# Patient Record
Sex: Female | Born: 1945
Health system: Southern US, Community
[De-identification: ages and names within clinical notes are randomized; demographics above are authoritative.]

## PROBLEM LIST (undated history)

## (undated) DIAGNOSIS — I10 Essential (primary) hypertension: Secondary | ICD-10-CM

## (undated) DIAGNOSIS — F329 Major depressive disorder, single episode, unspecified: Secondary | ICD-10-CM

## (undated) DIAGNOSIS — K589 Irritable bowel syndrome without diarrhea: Secondary | ICD-10-CM

## (undated) DIAGNOSIS — E785 Hyperlipidemia, unspecified: Secondary | ICD-10-CM

## (undated) DIAGNOSIS — J45909 Unspecified asthma, uncomplicated: Secondary | ICD-10-CM

## (undated) DIAGNOSIS — J439 Emphysema, unspecified: Secondary | ICD-10-CM

## (undated) DIAGNOSIS — I519 Heart disease, unspecified: Secondary | ICD-10-CM

## (undated) DIAGNOSIS — F32A Depression, unspecified: Secondary | ICD-10-CM

## (undated) DIAGNOSIS — E119 Type 2 diabetes mellitus without complications: Secondary | ICD-10-CM

## (undated) HISTORY — PX: OTHER SURGICAL HISTORY: SHX169

## (undated) HISTORY — PX: ABDOMINAL HYSTERECTOMY: SHX81

## (undated) HISTORY — DX: Depression, unspecified: F32.A

## (undated) HISTORY — DX: Unspecified asthma, uncomplicated: J45.909

## (undated) HISTORY — DX: Heart disease, unspecified: I51.9

## (undated) HISTORY — DX: Type 2 diabetes mellitus without complications: E11.9

## (undated) HISTORY — DX: Emphysema, unspecified: J43.9

## (undated) HISTORY — DX: Hyperlipidemia, unspecified: E78.5

## (undated) HISTORY — DX: Irritable bowel syndrome, unspecified: K58.9

## (undated) HISTORY — DX: Essential (primary) hypertension: I10

## (undated) HISTORY — PX: CHOLECYSTECTOMY: SHX55

## (undated) HISTORY — DX: Major depressive disorder, single episode, unspecified: F32.9

---

## 2016-11-08 DIAGNOSIS — D509 Iron deficiency anemia, unspecified: Secondary | ICD-10-CM | POA: Diagnosis not present

## 2016-11-08 DIAGNOSIS — J302 Other seasonal allergic rhinitis: Secondary | ICD-10-CM | POA: Diagnosis not present

## 2016-11-08 DIAGNOSIS — E119 Type 2 diabetes mellitus without complications: Secondary | ICD-10-CM | POA: Diagnosis not present

## 2016-11-08 DIAGNOSIS — M797 Fibromyalgia: Secondary | ICD-10-CM | POA: Diagnosis not present

## 2016-11-08 DIAGNOSIS — J449 Chronic obstructive pulmonary disease, unspecified: Secondary | ICD-10-CM | POA: Diagnosis not present

## 2016-11-08 DIAGNOSIS — E559 Vitamin D deficiency, unspecified: Secondary | ICD-10-CM | POA: Diagnosis not present

## 2016-11-08 DIAGNOSIS — I4891 Unspecified atrial fibrillation: Secondary | ICD-10-CM | POA: Diagnosis not present

## 2017-01-16 DIAGNOSIS — J45909 Unspecified asthma, uncomplicated: Secondary | ICD-10-CM | POA: Diagnosis not present

## 2017-01-16 DIAGNOSIS — Z0001 Encounter for general adult medical examination with abnormal findings: Secondary | ICD-10-CM | POA: Diagnosis not present

## 2017-01-16 DIAGNOSIS — F17210 Nicotine dependence, cigarettes, uncomplicated: Secondary | ICD-10-CM | POA: Diagnosis not present

## 2017-01-16 DIAGNOSIS — J449 Chronic obstructive pulmonary disease, unspecified: Secondary | ICD-10-CM | POA: Diagnosis not present

## 2017-01-16 DIAGNOSIS — E119 Type 2 diabetes mellitus without complications: Secondary | ICD-10-CM | POA: Diagnosis not present

## 2017-01-16 DIAGNOSIS — I050 Rheumatic mitral stenosis: Secondary | ICD-10-CM | POA: Diagnosis not present

## 2017-01-16 DIAGNOSIS — F17200 Nicotine dependence, unspecified, uncomplicated: Secondary | ICD-10-CM | POA: Diagnosis not present

## 2017-01-16 DIAGNOSIS — E669 Obesity, unspecified: Secondary | ICD-10-CM | POA: Diagnosis not present

## 2017-01-16 DIAGNOSIS — Z716 Tobacco abuse counseling: Secondary | ICD-10-CM | POA: Diagnosis not present

## 2017-03-03 DIAGNOSIS — I10 Essential (primary) hypertension: Secondary | ICD-10-CM | POA: Diagnosis not present

## 2017-03-03 DIAGNOSIS — F3289 Other specified depressive episodes: Secondary | ICD-10-CM | POA: Diagnosis not present

## 2017-03-03 DIAGNOSIS — R634 Abnormal weight loss: Secondary | ICD-10-CM | POA: Diagnosis not present

## 2017-03-03 DIAGNOSIS — J449 Chronic obstructive pulmonary disease, unspecified: Secondary | ICD-10-CM | POA: Diagnosis not present

## 2017-03-03 DIAGNOSIS — E784 Other hyperlipidemia: Secondary | ICD-10-CM | POA: Diagnosis not present

## 2017-03-03 DIAGNOSIS — F17210 Nicotine dependence, cigarettes, uncomplicated: Secondary | ICD-10-CM | POA: Diagnosis not present

## 2017-03-03 DIAGNOSIS — E119 Type 2 diabetes mellitus without complications: Secondary | ICD-10-CM | POA: Diagnosis not present

## 2017-03-03 DIAGNOSIS — I4891 Unspecified atrial fibrillation: Secondary | ICD-10-CM | POA: Diagnosis not present

## 2017-03-31 DIAGNOSIS — R634 Abnormal weight loss: Secondary | ICD-10-CM | POA: Diagnosis not present

## 2017-03-31 DIAGNOSIS — Z0000 Encounter for general adult medical examination without abnormal findings: Secondary | ICD-10-CM | POA: Diagnosis not present

## 2017-04-03 DIAGNOSIS — I4891 Unspecified atrial fibrillation: Secondary | ICD-10-CM | POA: Diagnosis not present

## 2017-04-03 DIAGNOSIS — F329 Major depressive disorder, single episode, unspecified: Secondary | ICD-10-CM | POA: Diagnosis not present

## 2017-04-03 DIAGNOSIS — J449 Chronic obstructive pulmonary disease, unspecified: Secondary | ICD-10-CM | POA: Diagnosis not present

## 2017-04-03 DIAGNOSIS — E785 Hyperlipidemia, unspecified: Secondary | ICD-10-CM | POA: Diagnosis not present

## 2017-04-03 DIAGNOSIS — I499 Cardiac arrhythmia, unspecified: Secondary | ICD-10-CM | POA: Diagnosis not present

## 2017-04-11 DIAGNOSIS — I493 Ventricular premature depolarization: Secondary | ICD-10-CM | POA: Diagnosis not present

## 2017-04-11 DIAGNOSIS — I10 Essential (primary) hypertension: Secondary | ICD-10-CM | POA: Diagnosis not present

## 2017-04-11 DIAGNOSIS — R5381 Other malaise: Secondary | ICD-10-CM | POA: Diagnosis not present

## 2017-04-11 DIAGNOSIS — E119 Type 2 diabetes mellitus without complications: Secondary | ICD-10-CM | POA: Diagnosis not present

## 2017-04-11 DIAGNOSIS — R634 Abnormal weight loss: Secondary | ICD-10-CM | POA: Diagnosis not present

## 2017-04-11 DIAGNOSIS — J449 Chronic obstructive pulmonary disease, unspecified: Secondary | ICD-10-CM | POA: Diagnosis not present

## 2017-07-18 DIAGNOSIS — R634 Abnormal weight loss: Secondary | ICD-10-CM | POA: Diagnosis not present

## 2017-07-18 DIAGNOSIS — I491 Atrial premature depolarization: Secondary | ICD-10-CM | POA: Diagnosis not present

## 2017-07-18 DIAGNOSIS — J449 Chronic obstructive pulmonary disease, unspecified: Secondary | ICD-10-CM | POA: Diagnosis not present

## 2017-07-18 DIAGNOSIS — E119 Type 2 diabetes mellitus without complications: Secondary | ICD-10-CM | POA: Diagnosis not present

## 2018-01-04 ENCOUNTER — Encounter: Payer: Self-pay | Admitting: Family Medicine

## 2018-01-04 ENCOUNTER — Ambulatory Visit (INDEPENDENT_AMBULATORY_CARE_PROVIDER_SITE_OTHER): Payer: Medicare Other | Admitting: Family Medicine

## 2018-01-04 VITALS — BP 130/78 | HR 78 | Temp 98.3°F | Resp 16 | Ht 61.0 in | Wt 127.1 lb

## 2018-01-04 DIAGNOSIS — J449 Chronic obstructive pulmonary disease, unspecified: Secondary | ICD-10-CM | POA: Insufficient documentation

## 2018-01-04 DIAGNOSIS — I119 Hypertensive heart disease without heart failure: Secondary | ICD-10-CM | POA: Diagnosis not present

## 2018-01-04 DIAGNOSIS — E1140 Type 2 diabetes mellitus with diabetic neuropathy, unspecified: Secondary | ICD-10-CM

## 2018-01-04 DIAGNOSIS — E1169 Type 2 diabetes mellitus with other specified complication: Secondary | ICD-10-CM

## 2018-01-04 DIAGNOSIS — E876 Hypokalemia: Secondary | ICD-10-CM

## 2018-01-04 DIAGNOSIS — J439 Emphysema, unspecified: Secondary | ICD-10-CM

## 2018-01-04 DIAGNOSIS — E785 Hyperlipidemia, unspecified: Secondary | ICD-10-CM

## 2018-01-04 DIAGNOSIS — F325 Major depressive disorder, single episode, in full remission: Secondary | ICD-10-CM | POA: Diagnosis not present

## 2018-01-04 DIAGNOSIS — M797 Fibromyalgia: Secondary | ICD-10-CM

## 2018-01-04 DIAGNOSIS — R2681 Unsteadiness on feet: Secondary | ICD-10-CM | POA: Diagnosis not present

## 2018-01-04 DIAGNOSIS — J441 Chronic obstructive pulmonary disease with (acute) exacerbation: Secondary | ICD-10-CM | POA: Insufficient documentation

## 2018-01-04 LAB — BASIC METABOLIC PANEL
BUN: 9 mg/dL (ref 6–23)
CHLORIDE: 90 meq/L — AB (ref 96–112)
CO2: 29 meq/L (ref 19–32)
Calcium: 10.2 mg/dL (ref 8.4–10.5)
Creatinine, Ser: 0.59 mg/dL (ref 0.40–1.20)
GFR: 106.11 mL/min (ref >60.00)
Glucose, Bld: 201 mg/dL — ABNORMAL HIGH (ref 70–99)
POTASSIUM: 4.6 meq/L (ref 3.5–5.1)
SODIUM: 129 meq/L — AB (ref 135–145)

## 2018-01-04 LAB — HEMOGLOBIN A1C: Hgb A1c MFr Bld: 6.3 % (ref 4.6–6.5)

## 2018-01-04 MED ORDER — PITAVASTATIN CALCIUM 2 MG PO TABS: 2.0000 mg | ORAL_TABLET | Freq: Every day | ORAL | 2 refills | Status: DC

## 2018-01-04 MED ORDER — SITAGLIPTIN PHOSPHATE 100 MG PO TABS: 100.0000 mg | ORAL_TABLET | Freq: Every day | ORAL | 0 refills | Status: DC

## 2018-01-04 MED ORDER — MIRTAZAPINE 30 MG PO TABS: 30.0000 mg | ORAL_TABLET | Freq: Every day | ORAL | 2 refills | Status: DC

## 2018-01-04 MED ORDER — METOPROLOL SUCCINATE ER 50 MG PO TB24: 50.0000 mg | ORAL_TABLET | Freq: Every day | ORAL | 1 refills | Status: DC

## 2018-01-04 MED ORDER — METFORMIN HCL 1000 MG PO TABS: 1000.0000 mg | ORAL_TABLET | Freq: Two times a day (BID) | ORAL | 1 refills | Status: DC

## 2018-01-04 MED ORDER — MONTELUKAST SODIUM 10 MG PO TABS: 10.0000 mg | ORAL_TABLET | Freq: Every day | ORAL | 3 refills | Status: DC

## 2018-01-04 MED ORDER — MILNACIPRAN HCL 50 MG PO TABS: 50.0000 mg | ORAL_TABLET | Freq: Two times a day (BID) | ORAL | 2 refills | Status: DC

## 2018-01-04 NOTE — Patient Instructions (Addendum)
A few things to remember from today's visit:   Type 2 diabetes mellitus with diabetic neuropathy, without long-term current use of insulin (HCC) - Plan: Basic metabolic panel, Hemoglobin A1c   Please be sure medication list is accurate. If a new problem present, please set up appointment sooner than planned today.

## 2018-01-04 NOTE — Progress Notes (Signed)
HPI:   [name] is a 72 y.o. female, who is here today with her son to establish care.  Former PCP: Dr Benjiman Core in [place].  Recently moved to the area, about 3 months ago.  She lives close to her son, alone. She lives alone,closed to her son. She does not drive.  Last preventive routine visit: Unsure.  Chronic medical problems: DM2, hypertension, hyperlipidemia, CAD, CHF, fibromyalgia, depression, IBS,"herniated disc",and an unstable gait among some. She has not established with cardiologist in the area, she tells me she is not interested in doing so.  According to her son, while she was in [place] she was following with cardiologist as needed.  A smoker since she was a teenager, she recently had low density CT scan and according to son it was normal. She is not interested in smoking cessation.  History of COPD/emphysema, she is supposed to be on Symbicort but she does not use it regularly.  She states that she has not needed inhalers in a few months. Her son is reporting PFTs about 2 years ago.  Sleeping on chair,"becasue I want to." She denies orthopnea or PND.   Depression: Currently she is on Remeron 30 mg daily.  According to her son, this medication was also added to help with her appetite. She reports problem as well controlled, denies depressed mood. No suicidal thoughts.  Fibromyalgia on Savella 50 mg bid.  HypoK+, she has not been on Bumix for 3 months. Still taking K+    Eye exam 3 years ago.  DM II Dx in 1980's. She is on Metformin and Januvia. Medications were adjusted 6 months ago because HgA1C was "low." Denies abdominal pain, nausea,vomiting, polydipsia,polyuria, or polyphagia.   HTN Dx in the 1980's. Denies severe/frequent headache, visual changes, chest pain, dyspnea, palpitation, claudication, focal weakness, or edema. On Metoprolol Succinate 50 mg daily  Review of Systems  Constitutional: Positive for fatigue. Negative for  activity change, appetite change and fever.  HENT: Negative for mouth sores, nosebleeds and trouble swallowing.   Eyes: Negative for redness and visual disturbance.  Respiratory: Negative for cough, shortness of breath and wheezing.   Cardiovascular: Negative for chest pain, palpitations and leg swelling.  Gastrointestinal: Negative for abdominal pain, nausea and vomiting.       Negative for changes in bowel habits.  Endocrine: Negative for polydipsia, polyphagia and polyuria.  Genitourinary: Negative for decreased urine volume, dysuria and hematuria.  Musculoskeletal: Positive for gait problem and myalgias.  Skin: Negative for rash and wound.  Neurological: Positive for numbness. Negative for syncope, weakness and headaches.  Psychiatric/Behavioral: Negative for confusion. The patient is not nervous/anxious.       Current Outpatient Medications on File Prior to Visit  Medication Sig Dispense Refill  . aspirin 81 MG tablet Take 81 mg by mouth daily.    . calcium citrate-vitamin D 500-400 MG-UNIT chewable tablet Chew 1 tablet by mouth daily.    . cetirizine (ZYRTEC) 10 MG tablet Take 10 mg by mouth daily.    . Cranberry 500 MG CAPS Take 500 mg by mouth daily.    Marland Kitchen desloratadine (CLARINEX) 5 MG tablet Take 5 mg by mouth daily.    Marland Kitchen Fexofenadine HCl (MUCINEX ALLERGY PO) Take 1,200 mg by mouth 2 (two) times daily.    . iron polysaccharides (NIFEREX) 150 MG capsule Take 150 mg by mouth daily.    . potassium chloride (K-DUR,KLOR-CON) 10 MEQ tablet Take 10 mEq by mouth daily.    Marland Kitchen  vitamin C (ASCORBIC ACID) 500 MG tablet Take 500 mg by mouth daily.    . vitamin E (VITAMIN E) 400 UNIT capsule Take 400 Units by mouth daily.     No current facility-administered medications on file prior to visit.      Past Medical History:  Diagnosis Date  . Asthma   . Depression   . Diabetes mellitus without complication (HCC)   . Emphysema of lung (HCC)   . Heart disease   . Hyperlipidemia   .  Hypertension   . Irritable bowel syndrome    Allergies  Allergen Reactions  . Bactrim [Sulfamethoxazole-Trimethoprim] Hives    Family History  Problem Relation Age of Onset  . Heart disease Mother   . Hypertension Mother   . Heart attack Father   . Heart disease Father   . Hyperlipidemia Father   . Hypertension Father   . Hypertension Sister   . Cancer Brother   . Depression Son   . Heart attack Son   . Heart disease Son   . Hyperlipidemia Son   . Hypertension Son   . Kidney disease Son   . Learning disabilities Son   . Mental illness Son   . Heart disease Sister   . Heart attack Sister   . Heart disease Sister   . Heart disease Brother   . Heart attack Brother   . Mental illness Brother   . Heart disease Brother   . Mental illness Brother     Social History   Socioeconomic History  . Marital status: Widowed    Spouse name: Not on file  . Number of children: 1  . Years of education: Not on file  . Highest education level: Not on file  Occupational History  . Not on file  Social Needs  . Financial resource strain: Not on file  . Food insecurity:    Worry: Not on file    Inability: Not on file  . Transportation needs:    Medical: Not on file    Non-medical: Not on file  Tobacco Use  . Smoking status: Current Every Day Smoker    Types: Cigarettes  . Smokeless tobacco: Never Used  Substance and Sexual Activity  . Alcohol use: Not Currently  . Drug use: Not Currently  . Sexual activity: Not Currently  Lifestyle  . Physical activity:    Days per week: Not on file    Minutes per session: Not on file  . Stress: Not on file  Relationships  . Social connections:    Talks on phone: Not on file    Gets together: Not on file    Attends religious service: Not on file    Active member of club or organization: Not on file    Attends meetings of clubs or organizations: Not on file    Relationship status: Not on file  Other Topics Concern  . Not on file    Social History Narrative  . Not on file    Vitals:   01/04/18 1414  BP: 130/78  Pulse: 78  Resp: 16  Temp: 98.3 F (36.8 C)  SpO2: 95%    Body mass index is 24.02 kg/m.   Physical Exam  Nursing note and vitals reviewed. Constitutional: She is oriented to person, place, and time. She appears well-developed. No distress.  HENT:  Head: Normocephalic and atraumatic.  Mouth/Throat: Oropharynx is clear and moist and mucous membranes are normal.  Eyes: Pupils are equal, round, and reactive to  light. Conjunctivae are normal.  Cardiovascular: Normal rate and regular rhythm.  No murmur heard. Pulses:      Dorsalis pedis pulses are 2+ on the right side, and 2+ on the left side.  Respiratory: Effort normal and breath sounds normal. No respiratory distress.  GI: Soft. She exhibits no mass. There is no tenderness.  Musculoskeletal: She exhibits no edema.  Lymphadenopathy:    She has no cervical adenopathy.  Neurological: She is alert and oriented to person, place, and time. She has normal strength. Gait abnormal.  Skin: Skin is warm. No erythema.  Psychiatric: She has a normal mood and affect.  Well groomed, good eye contact.   Diabetic Foot Exam - Simple   Simple Foot Form Diabetic Foot exam was performed with the following findings:  Yes 01/04/2018  3:22 PM  Visual Inspection See comments:  Yes Sensation Testing See comments:  Yes Pulse Check Posterior Tibialis and Dorsalis pulse intact bilaterally:  Yes Comments Decrease monofilament bilateral. Long toenails.      ASSESSMENT AND PLAN:   Ms. Marney was seen today for establish care.  Diagnoses and all orders for this visit:  Lab Results  Component Value Date   HGBA1C 6.3 01/04/2018   Lab Results  Component Value Date   CREATININE 0.59 01/04/2018   BUN 9 01/04/2018   NA 129 (L) 01/04/2018   K 4.6 01/04/2018   CL 90 (L) 01/04/2018   CO2 29 01/04/2018    Type 2 diabetes mellitus with diabetic neuropathy,  without long-term current use of insulin (HCC)  HgA1C pending. No changes in current management, medications will be adjusted if needed. Regular exercise as tolerated and healthy diet with avoidance of added sugar food intake is an important part of treatment and recommended. Annual eye exam, periodic dental and foot care recommended. F/U in 5-6 months  -     Basic metabolic panel -     Hemoglobin A1c -     metFORMIN (GLUCOPHAGE) 1000 MG tablet; Take 1 tablet (1,000 mg total) by mouth 2 (two) times daily with a meal. -     sitaGLIPtin (JANUVIA) 100 MG tablet; Take 1 tablet (100 mg total) by mouth daily.  Hypertension with heart disease  Adequately controlled. No changes in current management. DASH diet recommended. Eye exam recommended annually. F/U in 6 months, before if needed.  -     metoprolol succinate (TOPROL-XL) 50 MG 24 hr tablet; Take 1 tablet (50 mg total) by mouth daily. Take with or immediately following a meal.  Hyperlipidemia associated with type 2 diabetes mellitus (HCC)  No changes in current management. F/U in 6 months.  -     Pitavastatin Calcium 2 MG TABS; Take 1 tablet (2 mg total) by mouth daily.  Major depressive disorder in remission, unspecified whether recurrent (HCC) -     mirtazapine (REMERON) 30 MG tablet; Take 1 tablet (30 mg total) by mouth at bedtime.  Hypokalemia  Depending of K+ result we will determine if she needs to continue K+ supplementation.  Fibromyalgia  Stable. No changes in current management.  -     Milnacipran (SAVELLA) 50 MG TABS tablet; Take 1 tablet (50 mg total) by mouth every 12 (twelve) hours. Pulmonary emphysema, unspecified emphysema type (HCC)  She is not interested in smoking cessation. Continue Albuterol inh as needed.  Unstable gait  Fall precautions discussed. Encouraged to use her cane, she may even need a walker.  Other orders -     montelukast (SINGULAIR) 10  MG tablet; Take 1 tablet (10 mg total) by  mouth at bedtime.      Betty G. [place], MD  [facility]. [place] [facility].

## 2018-01-07 DIAGNOSIS — I119 Hypertensive heart disease without heart failure: Secondary | ICD-10-CM | POA: Insufficient documentation

## 2018-01-07 DIAGNOSIS — M797 Fibromyalgia: Secondary | ICD-10-CM | POA: Insufficient documentation

## 2018-01-07 DIAGNOSIS — F325 Major depressive disorder, single episode, in full remission: Secondary | ICD-10-CM | POA: Insufficient documentation

## 2018-01-07 DIAGNOSIS — E1140 Type 2 diabetes mellitus with diabetic neuropathy, unspecified: Secondary | ICD-10-CM | POA: Insufficient documentation

## 2018-01-07 DIAGNOSIS — E785 Hyperlipidemia, unspecified: Secondary | ICD-10-CM

## 2018-01-07 DIAGNOSIS — E1169 Type 2 diabetes mellitus with other specified complication: Secondary | ICD-10-CM | POA: Insufficient documentation

## 2018-04-05 ENCOUNTER — Other Ambulatory Visit: Payer: Self-pay | Admitting: Family Medicine

## 2018-04-05 DIAGNOSIS — E1140 Type 2 diabetes mellitus with diabetic neuropathy, unspecified: Secondary | ICD-10-CM

## 2018-06-29 ENCOUNTER — Other Ambulatory Visit: Payer: Self-pay | Admitting: *Deleted

## 2018-06-29 DIAGNOSIS — I119 Hypertensive heart disease without heart failure: Secondary | ICD-10-CM

## 2018-06-29 MED ORDER — METOPROLOL SUCCINATE ER 50 MG PO TB24: 50.0000 mg | ORAL_TABLET | Freq: Every day | ORAL | 1 refills | Status: DC

## 2018-07-02 ENCOUNTER — Encounter: Payer: Self-pay | Admitting: Family Medicine

## 2018-07-02 ENCOUNTER — Ambulatory Visit (INDEPENDENT_AMBULATORY_CARE_PROVIDER_SITE_OTHER): Payer: Medicare Other | Admitting: Family Medicine

## 2018-07-02 VITALS — BP 100/60 | HR 80 | Temp 97.6°F | Resp 16 | Ht 61.0 in | Wt 122.0 lb

## 2018-07-02 DIAGNOSIS — M797 Fibromyalgia: Secondary | ICD-10-CM | POA: Diagnosis not present

## 2018-07-02 DIAGNOSIS — E871 Hypo-osmolality and hyponatremia: Secondary | ICD-10-CM

## 2018-07-02 DIAGNOSIS — I119 Hypertensive heart disease without heart failure: Secondary | ICD-10-CM

## 2018-07-02 DIAGNOSIS — E1140 Type 2 diabetes mellitus with diabetic neuropathy, unspecified: Secondary | ICD-10-CM | POA: Diagnosis not present

## 2018-07-02 LAB — MICROALBUMIN / CREATININE URINE RATIO
Creatinine,U: 38.1 mg/dL
MICROALB UR: 16 mg/dL — AB (ref 0.0–1.9)
Microalb Creat Ratio: 42.1 mg/g — ABNORMAL HIGH (ref 0.0–30.0)

## 2018-07-02 LAB — BASIC METABOLIC PANEL
BUN: 10 mg/dL (ref 6–23)
CALCIUM: 10.3 mg/dL (ref 8.4–10.5)
CO2: 27 mEq/L (ref 19–32)
Chloride: 91 mEq/L — ABNORMAL LOW (ref 96–112)
Creatinine, Ser: 0.56 mg/dL (ref 0.40–1.20)
GFR: 112.54 mL/min (ref >60.00)
Glucose, Bld: 204 mg/dL — ABNORMAL HIGH (ref 70–99)
Potassium: 4.3 mEq/L (ref 3.5–5.1)
Sodium: 130 mEq/L — ABNORMAL LOW (ref 135–145)

## 2018-07-02 LAB — POCT GLYCOSYLATED HEMOGLOBIN (HGB A1C): Hemoglobin A1C: 6.4 % — AB (ref 4.0–5.6)

## 2018-07-02 MED ORDER — CETIRIZINE HCL 10 MG PO TABS: 10.0000 mg | ORAL_TABLET | Freq: Every day | ORAL | 1 refills | Status: DC

## 2018-07-02 MED ORDER — SITAGLIPTIN PHOSPHATE 50 MG PO TABS: 50.0000 mg | ORAL_TABLET | Freq: Every day | ORAL | 1 refills | Status: DC

## 2018-07-02 NOTE — Patient Instructions (Addendum)
A few things to remember from today's visit:   Hyponatremia - Plan: Basic metabolic panel  Type 2 diabetes mellitus with diabetic neuropathy, without long-term current use of insulin (HCC) - Plan: Microalbumin / creatinine urine ratio, POCT glycosylated hemoglobin (Hb A1C), sitaGLIPtin (JANUVIA) 50 MG tablet  Hypertension with heart disease - Plan: Basic metabolic panel  You need an eye exam. Decrease fluid intake. Januvia decreased from 100 mg to 50 mg. No changes in metformin or rest of your medications.  Please be sure medication list is accurate. If a new problem present, please set up appointment sooner than planned today.

## 2018-07-02 NOTE — Progress Notes (Signed)
HPI:   Dana [name] is a 72 y.o. female, who is here today with her son for 6 months follow up.   She was last seen on 01/05/19 HTN: She is on Metoprolol Succinate 50 mg daily. Denies severe/frequent headache, visual changes, chest pain, dyspnea, palpitation, claudication, focal weakness, or edema.  HypoNa++, she drinks water frequently during the day. No MS changes.  She is on Savella 50 mg bid for fibromyalgia.   Diabetes Mellitus II:   Currently on metformin 1000 mg twice daily and Januvia 100 mg daily.  Checking BS's : Not checking. Hypoglycemia:Negative.  She is is tolerating medications well. She denies abdominal pain, nausea, vomiting, polydipsia, polyuria, or polyphagia. Negative for numbness, tingling, or burning.   Lab Results  Component Value Date   CREATININE 0.59 01/04/2018   BUN 9 01/04/2018   NA 129 (L) 01/04/2018   K 4.6 01/04/2018   CL 90 (L) 01/04/2018   CO2 29 01/04/2018    Lab Results  Component Value Date   HGBA1C 6.3 01/04/2018     Review of Systems  Constitutional: Positive for fatigue. Negative for activity change, appetite change and fever.  HENT: Negative for mouth sores, nosebleeds, sore throat and trouble swallowing.   Eyes: Negative for redness and visual disturbance.  Respiratory: Negative for cough, shortness of breath and wheezing.   Cardiovascular: Negative for chest pain, palpitations and leg swelling.  Gastrointestinal: Negative for abdominal pain, nausea and vomiting.       Negative for changes in bowel habits.  Endocrine: Negative for polydipsia, polyphagia and polyuria.  Genitourinary: Negative for decreased urine volume, dysuria and hematuria.  Musculoskeletal: Positive for arthralgias and myalgias.  Neurological: Negative for syncope, weakness and headaches.  Psychiatric/Behavioral: Negative for confusion. The patient is nervous/anxious.     Current Outpatient Medications on File Prior to Visit    Medication Sig Dispense Refill  . aspirin 81 MG tablet Take 81 mg by mouth daily.    . calcium citrate-vitamin D 500-400 MG-UNIT chewable tablet Chew 1 tablet by mouth daily.    . Cranberry 500 MG CAPS Take 500 mg by mouth daily.    Marland Kitchen. desloratadine (CLARINEX) 5 MG tablet Take 5 mg by mouth daily.    . iron polysaccharides (NIFEREX) 150 MG capsule Take 150 mg by mouth daily.    . metoprolol succinate (TOPROL-XL) 50 MG 24 hr tablet Take 1 tablet (50 mg total) by mouth daily. Take with or immediately following a meal. 90 tablet 1  . Milnacipran (SAVELLA) 50 MG TABS tablet Take 1 tablet (50 mg total) by mouth every 12 (twelve) hours. 180 tablet 2  . mirtazapine (REMERON) 30 MG tablet Take 1 tablet (30 mg total) by mouth at bedtime. 90 tablet 2  . montelukast (SINGULAIR) 10 MG tablet Take 1 tablet (10 mg total) by mouth at bedtime. 90 tablet 3  . Pitavastatin Calcium 2 MG TABS Take 1 tablet (2 mg total) by mouth daily. 90 tablet 2  . vitamin C (ASCORBIC ACID) 500 MG tablet Take 500 mg by mouth daily.    . vitamin E (VITAMIN E) 400 UNIT capsule Take 400 Units by mouth daily.     No current facility-administered medications on file prior to visit.      Past Medical History:  Diagnosis Date  . Asthma   . Depression   . Diabetes mellitus without complication (HCC)   . Emphysema of lung (HCC)   . Heart disease   .  Hyperlipidemia   . Hypertension   . Irritable bowel syndrome    Allergies  Allergen Reactions  . Bactrim [Sulfamethoxazole-Trimethoprim] Hives    Social History   Socioeconomic History  . Marital status: Widowed    Spouse name: Not on file  . Number of children: 1  . Years of education: Not on file  . Highest education level: Not on file  Occupational History  . Not on file  Social Needs  . Financial resource strain: Not on file  . Food insecurity:    Worry: Not on file    Inability: Not on file  . Transportation needs:    Medical: Not on file    Non-medical: Not  on file  Tobacco Use  . Smoking status: Current Every Day Smoker    Types: Cigarettes  . Smokeless tobacco: Never Used  Substance and Sexual Activity  . Alcohol use: Not Currently  . Drug use: Not Currently  . Sexual activity: Not Currently  Lifestyle  . Physical activity:    Days per week: Not on file    Minutes per session: Not on file  . Stress: Not on file  Relationships  . Social connections:    Talks on phone: Not on file    Gets together: Not on file    Attends religious service: Not on file    Active member of club or organization: Not on file    Attends meetings of clubs or organizations: Not on file    Relationship status: Not on file  Other Topics Concern  . Not on file  Social History Narrative  . Not on file    Vitals:   07/02/18 1427  BP: 100/60  Pulse: 80  Resp: 16  Temp: 97.6 F (36.4 C)  SpO2: 95%   Body mass index is 23.05 kg/m.   Physical Exam  Nursing note and vitals reviewed. Constitutional: She is oriented to person, place, and time. She appears well-developed and well-nourished. No distress.  HENT:  Head: Normocephalic and atraumatic.  Mouth/Throat: Oropharynx is clear and moist and mucous membranes are normal.  Eyes: Pupils are equal, round, and reactive to light. Conjunctivae are normal.  Cardiovascular: Normal rate and regular rhythm.  No murmur heard. Pulses:      Dorsalis pedis pulses are 2+ on the right side, and 2+ on the left side.  Respiratory: Effort normal and breath sounds normal. No respiratory distress.  GI: Soft. She exhibits no mass. There is no hepatomegaly. There is no tenderness.  Musculoskeletal: She exhibits no edema.  Lymphadenopathy:    She has no cervical adenopathy.  Neurological: She is alert and oriented to person, place, and time. She has normal strength. No cranial nerve deficit. Gait normal.  Skin: Skin is warm. No rash noted. No erythema.  Psychiatric: She has a normal mood and affect.  Well groomed,  good eye contact.     ASSESSMENT AND PLAN:   Dana [name] was seen today for 6 months follow-up.  Orders Placed This Encounter  Procedures  . Basic metabolic panel  . Microalbumin / creatinine urine ratio  . POCT glycosylated hemoglobin (Hb A1C)   Lab Results  Component Value Date   MICROALBUR 16.0 (H) 07/02/2018   Lab Results  Component Value Date   CREATININE 0.56 07/02/2018   BUN 10 07/02/2018   NA 130 (L) 07/02/2018   K 4.3 07/02/2018   CL 91 (L) 07/02/2018   CO2 27 07/02/2018   Lab Results  Component  Value Date   HGBA1C 6.4 (A) 07/02/2018     Type 2 diabetes mellitus with diabetic neuropathy, without long-term current use of insulin (HCC) HgA1C at goal. She agrees with decreasing Januvia from 100 mg to 50 mg. No changes in metformin. Regular exercise and healthy diet with avoidance of added sugar food intake is an important part of treatment and recommended. Annual eye exam, periodic dental and foot care recommended. F/U in 5-6 months   Hyponatremia Recommend limiting fluid intake. Savela could aggravate problem. Instructed about warning signs.  Hypertension with heart disease Adequately controlled. No changes in current management. DASH-low salt  diet recommended. Eye exam recommended annually. F/U in 6 months, before if needed.  Fibromyalgia Problem is well controlled with Savella. Some side effects discussed. No changes today.     Manas Hickling G. [place], MD  [facility]. [place] office.

## 2018-07-02 NOTE — Assessment & Plan Note (Signed)
HgA1C at goal. She agrees with decreasing Januvia from 100 mg to 50 mg. No changes in metformin. Regular exercise and healthy diet with avoidance of added sugar food intake is an important part of treatment and recommended. Annual eye exam, periodic dental and foot care recommended. F/U in 5-6 months

## 2018-07-03 ENCOUNTER — Other Ambulatory Visit: Payer: Self-pay | Admitting: Family Medicine

## 2018-07-03 DIAGNOSIS — E1140 Type 2 diabetes mellitus with diabetic neuropathy, unspecified: Secondary | ICD-10-CM

## 2018-10-01 ENCOUNTER — Other Ambulatory Visit: Payer: Self-pay | Admitting: Family Medicine

## 2018-10-01 DIAGNOSIS — M797 Fibromyalgia: Secondary | ICD-10-CM

## 2018-10-01 DIAGNOSIS — F325 Major depressive disorder, single episode, in full remission: Secondary | ICD-10-CM

## 2018-10-04 ENCOUNTER — Other Ambulatory Visit: Payer: Self-pay | Admitting: Family Medicine

## 2018-10-04 NOTE — Telephone Encounter (Signed)
Copied from CRM [id number]. Topic: Quick Communication - Rx Refill/Question >> Oct 04, 2018  3:38 PM [place], [place] D wrote: Medication: desloratadine (CLARINEX) 5 MG tablet / Pt's son called to request rx for medication. Please advise.  Has the patient contacted their pharmacy? Yes.   (Agent: If no, request that the patient contact the pharmacy for the refill.) (Agent: If yes, when and what did the pharmacy advise?)  Preferred Pharmacy (with phone number or street name): [facility] - [place], [place] - [address] [phone] (Phone) [phone] (Fax)  Agent: Please be advised that RX refills may take up to 3 business days. We ask that you follow-up with your pharmacy.

## 2018-10-04 NOTE — Telephone Encounter (Signed)
Requested medication (s) are due for refill today: unknown amount dispensed  Requested medication (s) are on the active medication list: yes-historical med  Last refill:  01/04/18  Future visit scheduled: yes   Notes to clinic:  Historical medication    Requested Prescriptions  Pending Prescriptions Disp Refills   desloratadine (CLARINEX) 5 MG tablet      Sig: Take 1 tablet (5 mg total) by mouth daily.     Ear, Nose, and Throat:  Antihistamines Passed - 10/04/2018  3:44 PM      Passed - Valid encounter within last 12 months    Recent Outpatient Visits          3 months ago Hyponatremia   Adult nurse [facility] [place], Timoteo Expose, MD   9 months ago Type 2 diabetes mellitus with diabetic neuropathy, without long-term current use of insulin (HCC)   Adult nurse [facility] [place], Timoteo Expose, MD      Future Appointments            In 2 months [place], Timoteo Expose, MD [facility] [place]

## 2018-10-05 ENCOUNTER — Other Ambulatory Visit: Payer: Self-pay | Admitting: Family Medicine

## 2018-10-05 DIAGNOSIS — E1169 Type 2 diabetes mellitus with other specified complication: Secondary | ICD-10-CM

## 2018-10-05 DIAGNOSIS — E785 Hyperlipidemia, unspecified: Principal | ICD-10-CM

## 2018-10-05 MED ORDER — DESLORATADINE 5 MG PO TABS: 5.0000 mg | ORAL_TABLET | Freq: Every day | ORAL | 1 refills | Status: DC

## 2018-12-11 ENCOUNTER — Other Ambulatory Visit: Payer: Self-pay | Admitting: Family Medicine

## 2018-12-11 DIAGNOSIS — E1140 Type 2 diabetes mellitus with diabetic neuropathy, unspecified: Secondary | ICD-10-CM

## 2018-12-24 ENCOUNTER — Other Ambulatory Visit: Payer: Self-pay

## 2018-12-24 ENCOUNTER — Ambulatory Visit (INDEPENDENT_AMBULATORY_CARE_PROVIDER_SITE_OTHER): Payer: Medicare Other | Admitting: Family Medicine

## 2018-12-24 ENCOUNTER — Encounter: Payer: Self-pay | Admitting: Family Medicine

## 2018-12-24 VITALS — BP 161/71 | HR 71 | Resp 16

## 2018-12-24 DIAGNOSIS — E871 Hypo-osmolality and hyponatremia: Secondary | ICD-10-CM | POA: Diagnosis not present

## 2018-12-24 DIAGNOSIS — F17200 Nicotine dependence, unspecified, uncomplicated: Secondary | ICD-10-CM | POA: Diagnosis not present

## 2018-12-24 DIAGNOSIS — I119 Hypertensive heart disease without heart failure: Secondary | ICD-10-CM | POA: Diagnosis not present

## 2018-12-24 DIAGNOSIS — M797 Fibromyalgia: Secondary | ICD-10-CM | POA: Diagnosis not present

## 2018-12-24 DIAGNOSIS — E1140 Type 2 diabetes mellitus with diabetic neuropathy, unspecified: Secondary | ICD-10-CM

## 2018-12-24 NOTE — Progress Notes (Signed)
Virtual Visit via Video Note   I connected with Dana [name] and her son on 12/24/18 at  2:00 PM EDT by a video enabled telemedicine application and verified that I am speaking with the correct person using two identifiers.  Location patient: home Location provider:home office Persons participating in the virtual visit: patient, provider  I discussed the limitations of evaluation and management by telemedicine and the availability of in person appointments. She expressed understanding and agreed to proceed.   HPI: Dana [name] is a 73 yo female,who was last seen 07/02/18. Today we are following on chronic problems,she has no new concerns. DM II,she is on Januvia 50 mg daily and Metformin 1000 mg bid. Denies abdominal pain, nausea,vomiting, polydipsia,polyuria, or polyphagia.  She is not checking BS's.  Lab Results  Component Value Date   HGBA1C 6.4 (A) 07/02/2018   Lab Results  Component Value Date   MICROALBUR 16.0 (H) 07/02/2018   HTN, she is on Metoprolol Succinate 50 mg daily. She does not check BP at home,today her son is with her and checked BP,mildly elevated. Denies severe/frequent headache, visual changes, chest pain, dyspnea, palpitation, focal weakness, or edema.  Mild hypoNa++,she has not noted Dana changes,numbness,tingling,abdominal pain,or changes in bowel habits. I have recommended fluid restriction.  Lab Results  Component Value Date   CREATININE 0.56 07/02/2018   BUN 10 07/02/2018   NA 130 (L) 07/02/2018   K 4.3 07/02/2018   CL 91 (L) 07/02/2018   CO2 27 07/02/2018   Fibromyalgia,she takes Savella 50 mg bid.She has no complaints about myalgias or arthralgias. Pain is not limiting daily activities.  Depression ,she takes Remeron 30 mg daily. Sleeping well. Tolerating medication well. She denies depressed mood or suicidal thoughts.  Still smoking and not interested in smoking cessation. Tobacco use helps her with anxiety,which has been mildly worse due to  current COVID 19 pandemia.  Hx of COPD,denies respiratory symptoms.  ROS: See pertinent positives and negatives per HPI.  Past Medical History:  Diagnosis Date  . Asthma   . Depression   . Diabetes mellitus without complication (HCC)   . Emphysema of lung (HCC)   . Heart disease   . Hyperlipidemia   . Hypertension   . Irritable bowel syndrome     Past Surgical History:  Procedure Laterality Date  . ABDOMINAL HYSTERECTOMY    . bladder tack    . CHOLECYSTECTOMY      Family History  Problem Relation Age of Onset  . Heart disease Mother   . Hypertension Mother   . Heart attack Father   . Heart disease Father   . Hyperlipidemia Father   . Hypertension Father   . Hypertension Sister   . Cancer Brother   . Depression Son   . Heart attack Son   . Heart disease Son   . Hyperlipidemia Son   . Hypertension Son   . Kidney disease Son   . Learning disabilities Son   . Mental illness Son   . Heart disease Sister   . Heart attack Sister   . Heart disease Sister   . Heart disease Brother   . Heart attack Brother   . Mental illness Brother   . Heart disease Brother   . Mental illness Brother     Social History   Socioeconomic History  . Marital status: Widowed    Spouse name: Not on file  . Number of children: 1  . Years of education: Not on file  . Highest  education level: Not on file  Occupational History  . Not on file  Social Needs  . Financial resource strain: Not on file  . Food insecurity:    Worry: Not on file    Inability: Not on file  . Transportation needs:    Medical: Not on file    Non-medical: Not on file  Tobacco Use  . Smoking status: Current Every Day Smoker    Types: Cigarettes  . Smokeless tobacco: Never Used  Substance and Sexual Activity  . Alcohol use: Not Currently  . Drug use: Not Currently  . Sexual activity: Not Currently  Lifestyle  . Physical activity:    Days per week: Not on file    Minutes per session: Not on file  .  Stress: Not on file  Relationships  . Social connections:    Talks on phone: Not on file    Gets together: Not on file    Attends religious service: Not on file    Active member of club or organization: Not on file    Attends meetings of clubs or organizations: Not on file    Relationship status: Not on file  . Intimate partner violence:    Fear of current or ex partner: Not on file    Emotionally abused: Not on file    Physically abused: Not on file    Forced sexual activity: Not on file  Other Topics Concern  . Not on file  Social History Narrative  . Not on file      Current Outpatient Medications:  .  aspirin 81 MG tablet, Take 81 mg by mouth daily., Disp: , Rfl:  .  calcium citrate-vitamin D 500-400 MG-UNIT chewable tablet, Chew 1 tablet by mouth daily., Disp: , Rfl:  .  cetirizine (ZYRTEC) 10 MG tablet, Take 1 tablet (10 mg total) by mouth daily., Disp: 30 tablet, Rfl: 1 .  Cranberry 500 MG CAPS, Take 500 mg by mouth daily., Disp: , Rfl:  .  desloratadine (CLARINEX) 5 MG tablet, Take 1 tablet (5 mg total) by mouth daily., Disp: 90 tablet, Rfl: 1 .  iron polysaccharides (NIFEREX) 150 MG capsule, Take 150 mg by mouth daily., Disp: , Rfl:  .  JANUVIA 50 MG tablet, TAKE 1 TABLET DAILY, Disp: 90 tablet, Rfl: 3 .  LIVALO 2 MG TABS, TAKE 1 TABLET DAILY, Disp: 90 tablet, Rfl: 4 .  metFORMIN (GLUCOPHAGE) 1000 MG tablet, TAKE 1 TABLET TWICE A DAY WITH MEALS, Disp: 180 tablet, Rfl: 4 .  metoprolol succinate (TOPROL-XL) 50 MG 24 hr tablet, Take 1 tablet (50 mg total) by mouth daily. Take with or immediately following a meal., Disp: 90 tablet, Rfl: 1 .  mirtazapine (REMERON) 30 MG tablet, TAKE 1 TABLET AT BEDTIME, Disp: 90 tablet, Rfl: 2 .  montelukast (SINGULAIR) 10 MG tablet, Take 1 tablet (10 mg total) by mouth at bedtime., Disp: 90 tablet, Rfl: 3 .  SAVELLA 50 MG TABS tablet, TAKE 1 TABLET EVERY 12 HOURS, Disp: 180 tablet, Rfl: 2 .  vitamin C (ASCORBIC ACID) 500 MG tablet, Take 500  mg by mouth daily., Disp: , Rfl:  .  vitamin E (VITAMIN E) 400 UNIT capsule, Take 400 Units by mouth daily., Disp: , Rfl:   EXAM:  VITALS per patient if applicable:BP (!) 161/71   Pulse 71   Resp 16   GENERAL: alert, appears well and in no acute distress  HEENT: atraumatic, conjunctiva clear, no obvious facial abnormalities on inspection.  NECK: otherwise  normal movements of the head and neck  LUNGS: on inspection no signs of respiratory distress, breathing rate appears normal, no obvious gross SOB, gasping or wheezing  CV: no obvious cyanosis  Dana: moves all visible extremities without noticeable abnormality  PSYCH/NEURO: pleasant and cooperative, no obvious depression or anxiety, speech and thought processing grossly intact  ASSESSMENT AND PLAN:  Discussed the following assessment and plan:  Type 2 diabetes mellitus with diabetic neuropathy, without long-term current use of insulin (HCC)  Hypertension with heart disease  Fibromyalgia  Tobacco use disorder  Hyponatremia  Type 2 diabetes mellitus with diabetic neuropathy, without long-term current use of insulin (HCC) Problem has been well controlled. She does not feel comfortable going to the office for lab work. No changes in current management. She does not like to do fingerstick. Eye exam is current. Adequate foot care is also recommended. Follow-up in 4 months.  Hypertension with heart disease BP elevated today but has been on the lower normal range in the office. For now I recommend continue same medications. Her son will try to check BP every day or every other day and will let me know about BP readings in 2 weeks. Continue low-salt diet.  Fibromyalgia Problem is stable. No changes in [place]. Fall precautions.  Tobacco use disorder We discussed how this effects of tobacco use as well as benefits of smoking cessation. She is not interested in quitting smoking.  Hyponatremia Currently she is  asymptomatic. She does not feel comfortable going to the lab to get blood work, so we will plan on checking BMP next visit. Some of her meds could aggravate problem. Continue fluid restriction. Instructed about warning signs.     I discussed the assessment and treatment plan with the patient. The patient was provided an opportunity to ask questions and all were answered. The patient agreed with the plan and demonstrated an understanding of the instructions.     Return in about 4 months (around 04/26/2019) for HTN,DM,fibromyalgia.    Tayt Moyers [place], MD

## 2018-12-24 NOTE — Assessment & Plan Note (Signed)
We discussed how this effects of tobacco use as well as benefits of smoking cessation. She is not interested in quitting smoking.

## 2018-12-24 NOTE — Assessment & Plan Note (Addendum)
Currently she is asymptomatic. She does not feel comfortable going to the lab to get blood work, so we will plan on checking BMP next visit. Some of her meds could aggravate problem. Continue fluid restriction. Instructed about warning signs.

## 2018-12-24 NOTE — Assessment & Plan Note (Signed)
Problem has been well controlled. She does not feel comfortable going to the office for lab work. No changes in current management. She does not like to do fingerstick. Eye exam is current. Adequate foot care is also recommended. Follow-up in 4 months.

## 2018-12-24 NOTE — Assessment & Plan Note (Signed)
Problem is stable. No changes in [place]. Fall precautions.

## 2018-12-24 NOTE — Assessment & Plan Note (Signed)
BP elevated today but has been on the lower normal range in the office. For now I recommend continue same medications. Her son will try to check BP every day or every other day and will let me know about BP readings in 2 weeks. Continue low-salt diet.

## 2018-12-26 ENCOUNTER — Other Ambulatory Visit: Payer: Self-pay | Admitting: Family Medicine

## 2018-12-26 DIAGNOSIS — I119 Hypertensive heart disease without heart failure: Secondary | ICD-10-CM

## 2018-12-30 ENCOUNTER — Other Ambulatory Visit: Payer: Self-pay | Admitting: Family Medicine

## 2019-01-04 ENCOUNTER — Telehealth: Payer: Self-pay | Admitting: Family Medicine

## 2019-01-04 NOTE — Telephone Encounter (Signed)
Copied from CRM [id number]. Topic: Quick Communication - See Telephone Encounter >> Jan 04, 2019  1:55 PM Angela Nevin wrote: CRM for notification. See Telephone encounter for: 01/04/19.  Patient's son, Carmelia Roller, calling as advised at last visit to leave BP readings.   (Dewayne could not remember exact dates)  133/59 with HR 79 158/67 HR67   Today 1:45pm - 158/71 HR 68  UST NOWE TODAY [id number]

## 2019-01-04 NOTE — Telephone Encounter (Signed)
Message sent to Dr. Jordan for review and approval. 

## 2019-01-10 ENCOUNTER — Telehealth: Payer: Self-pay | Admitting: Family Medicine

## 2019-01-10 NOTE — Telephone Encounter (Signed)
Copied from CRM [id number]. Topic: General - Other >> Jan 10, 2019  1:18 PM Dana [name] wrote: Reason for CRM: The patient's son called today about his mother's high blood pressure. He has been waiting on a phone call since last week and needs to know if they are supposed to increase or decrease the patient's blood pressure medication. Please advise

## 2019-01-10 NOTE — Telephone Encounter (Signed)
Message sent to Dr. Jordan for review. 

## 2019-01-11 ENCOUNTER — Other Ambulatory Visit: Payer: Self-pay | Admitting: *Deleted

## 2019-01-11 MED ORDER — LOSARTAN POTASSIUM 25 MG PO TABS: 25.0000 mg | ORAL_TABLET | Freq: Every day | ORAL | 3 refills | Status: DC

## 2019-01-11 MED FILL — LOSARTAN POTASSIUM 25 MG TA: 25 | 30 days supply | Qty: 30 | Fill #0

## 2019-01-11 NOTE — Telephone Encounter (Signed)
Patient's son given directions per Dr. [place] and verbalized understanding. Rx for Losartan sent to the pharmacy. Patient's son stated that he would have to call back when he had his schedule in hand to schedule lab appointment.

## 2019-01-11 NOTE — Telephone Encounter (Signed)
SBP mildly elevated. If still SBP is > 140, Losartan 25 mg can be added. She will need BMP 7-10 days after starting medication. No changes in Metoprolol dose. Continue monitoring BP.  Thanks, BJ

## 2019-01-11 NOTE — Telephone Encounter (Signed)
Patient's son given directions per Dr. Jordan and verbalized understanding. Rx for Losartan sent to the pharmacy. Patient's son stated that he would have to call back when he had his schedule in hand to schedule lab appointment. 

## 2019-01-11 NOTE — Telephone Encounter (Signed)
Losartan 25 mg daily can be added. BMP in 7-10 days. No changes in metoprolol. Continue monitoring BP.  Thanks, BJ

## 2019-01-25 ENCOUNTER — Other Ambulatory Visit (INDEPENDENT_AMBULATORY_CARE_PROVIDER_SITE_OTHER): Payer: Medicare Other

## 2019-01-25 ENCOUNTER — Other Ambulatory Visit: Payer: Self-pay | Admitting: Family Medicine

## 2019-01-25 ENCOUNTER — Other Ambulatory Visit: Payer: Self-pay

## 2019-01-25 DIAGNOSIS — I119 Hypertensive heart disease without heart failure: Secondary | ICD-10-CM

## 2019-01-26 LAB — BASIC METABOLIC PANEL
BUN/Creatinine Ratio: 19 (calc) (ref 6–22)
BUN: 10 mg/dL (ref 7–25)
CO2: 27 mmol/L (ref 20–32)
Calcium: 10.7 mg/dL — ABNORMAL HIGH (ref 8.6–10.4)
Chloride: 92 mmol/L — ABNORMAL LOW (ref 98–110)
Creat: 0.54 mg/dL — ABNORMAL LOW (ref 0.60–0.93)
Glucose, Bld: 121 mg/dL — ABNORMAL HIGH (ref 65–99)
Potassium: 4.4 mmol/L (ref 3.5–5.3)
Sodium: 131 mmol/L — ABNORMAL LOW (ref 135–146)

## 2019-02-05 MED FILL — LOSARTAN POTASSIUM 25 MG TA: 25 | 30 days supply | Qty: 30 | Fill #1

## 2019-03-11 MED FILL — LOSARTAN POTASSIUM 25 MG TA: 25 | 30 days supply | Qty: 30 | Fill #2

## 2019-04-02 ENCOUNTER — Encounter: Payer: Self-pay | Admitting: Family Medicine

## 2019-04-02 ENCOUNTER — Other Ambulatory Visit: Payer: Self-pay

## 2019-04-02 ENCOUNTER — Ambulatory Visit (INDEPENDENT_AMBULATORY_CARE_PROVIDER_SITE_OTHER): Payer: Medicare Other | Admitting: Family Medicine

## 2019-04-02 VITALS — BP 120/68 | HR 93 | Temp 98.7°F | Resp 15 | Ht 61.0 in | Wt 122.8 lb

## 2019-04-02 DIAGNOSIS — J309 Allergic rhinitis, unspecified: Secondary | ICD-10-CM

## 2019-04-02 DIAGNOSIS — I119 Hypertensive heart disease without heart failure: Secondary | ICD-10-CM

## 2019-04-02 DIAGNOSIS — E1140 Type 2 diabetes mellitus with diabetic neuropathy, unspecified: Secondary | ICD-10-CM

## 2019-04-02 DIAGNOSIS — N182 Chronic kidney disease, stage 2 (mild): Secondary | ICD-10-CM

## 2019-04-02 DIAGNOSIS — M797 Fibromyalgia: Secondary | ICD-10-CM

## 2019-04-02 DIAGNOSIS — E1129 Type 2 diabetes mellitus with other diabetic kidney complication: Secondary | ICD-10-CM

## 2019-04-02 DIAGNOSIS — R809 Proteinuria, unspecified: Secondary | ICD-10-CM | POA: Diagnosis not present

## 2019-04-02 DIAGNOSIS — F325 Major depressive disorder, single episode, in full remission: Secondary | ICD-10-CM

## 2019-04-02 DIAGNOSIS — E871 Hypo-osmolality and hyponatremia: Secondary | ICD-10-CM

## 2019-04-02 LAB — BASIC METABOLIC PANEL
BUN: 11 mg/dL (ref 6–23)
CO2: 28 mEq/L (ref 19–32)
Calcium: 9.8 mg/dL (ref 8.4–10.5)
Chloride: 91 mEq/L — ABNORMAL LOW (ref 96–112)
Creatinine, Ser: 0.5 mg/dL (ref 0.40–1.20)
GFR: 120.43 mL/min (ref >60.00)
Glucose, Bld: 179 mg/dL — ABNORMAL HIGH (ref 70–99)
Potassium: 4.4 mEq/L (ref 3.5–5.1)
Sodium: 127 mEq/L — ABNORMAL LOW (ref 135–145)

## 2019-04-02 LAB — POCT GLYCOSYLATED HEMOGLOBIN (HGB A1C): Hemoglobin A1C: 6.5 % — AB (ref 4.0–5.6)

## 2019-04-02 LAB — MICROALBUMIN / CREATININE URINE RATIO
Creatinine,U: 20.2 mg/dL
Microalb Creat Ratio: 62.9 mg/g — ABNORMAL HIGH (ref 0.0–30.0)
Microalb, Ur: 12.7 mg/dL — ABNORMAL HIGH (ref 0.0–1.9)

## 2019-04-02 MED ORDER — LOSARTAN POTASSIUM 25 MG PO TABS: 25.0000 mg | ORAL_TABLET | Freq: Every day | ORAL | 2 refills | Status: DC

## 2019-04-02 MED ORDER — DESLORATADINE 5 MG PO TABS: 5.0000 mg | ORAL_TABLET | Freq: Every day | ORAL | 2 refills | Status: DC

## 2019-04-02 NOTE — Patient Instructions (Addendum)
Health Maintenance Due  Topic Date Due  . Hepatitis C Screening  Jul 04, 1946  . OPHTHALMOLOGY EXAM  09/17/1954  . MAMMOGRAM  09/17/1994  . COLONOSCOPY  09/17/1994  . DEXA SCAN  09/17/2009  . HEMOGLOBIN A1C  12/31/2018  . FOOT EXAM  01/05/2019    Depression screen PHQ 2/9 01/04/2018  Decreased Interest 0  Down, Depressed, Hopeless 0  PHQ - 2 Score 0   A few things to remember from today's visit:   Hypertension with heart disease - Plan: Basic metabolic panel  Type 2 diabetes mellitus with diabetic neuropathy, without long-term current use of insulin (HCC) - Plan: Microalbumin / creatinine urine ratio, POCT glycosylated hemoglobin (Hb A1C)  Hyponatremia - Plan: Basic metabolic panel  Allergic rhinitis, unspecified seasonality, unspecified trigger No changes today. I will see her back in 6 months, this visit can be virtually.  Please have vital signs for the visit.  Please be sure medication list is accurate. If a new problem present, please set up appointment sooner than planned today.

## 2019-04-02 NOTE — Progress Notes (Signed)
HPI:   Dana [name] is a 73 y.o. female, who is here today with her son for chronic disease management.  She was last seen on 12/24/2018. BP was elevated last visit, management was not changed but she was instructed to monitor BP at home. Currently she is taking losartan 25 mg daily and metoprolol XL 50 mg daily. BPs readings at home 130s-140s/80s. Denies severe/frequent headache, visual changes, chest pain, dyspnea,focal weakness, or edema. HTN since 1980's.  HypoNa++ . She is following recommendations in regard to fluid restriction. Has Not Noted MS Changes. She has not complained about muscle cramps, abdominal pain, nausea, vomiting, or changes in bowel habits.  Lab Results  Component Value Date   CREATININE 0.54 (L) 01/25/2019   BUN 10 01/25/2019   NA 131 (L) 01/25/2019   K 4.4 01/25/2019   CL 92 (L) 01/25/2019   CO2 27 01/25/2019   DM II: Dx in mid 1980's. Denies polydipsia,polyuria, or polyphagia. Currently she is on Januvia 50 mg daily and Metformin 1000 mg bid.  Lab Results  Component Value Date   HGBA1C 6.4 (A) 07/02/2018    Lab Results  Component Value Date   MICROALBUR 16.0 (H) 07/02/2018   Also requesting a refill on Clarinex 5 mg, which she takes daily along with cetirizine 10 mg daily for allergic rhinitis. According to her son, this is the only combination that has helped with symptoms. She is also on Singulair 10 mg daily.  She is tolerating medication well, no side effects reported. + Smoker, not interested in smoking cessation.  Denies cough, wheezing, or dyspnea. Fibromyalgia, currently she is on Savella 50 mg twice daily. Her son has not noted complaints about myalgias or arthritis.  Here in the office she denies having any problem.  Depression: Symptoms have been stable. She is dealing better with changes in social interaction related with COVID 19 pandemia.  She is on Mirtazapine 30 mg daily.   Review of Systems   Constitutional: Positive for fatigue. Negative for activity change, appetite change and fever.  HENT: Negative for mouth sores, nosebleeds and trouble swallowing.   Eyes: Negative for pain and redness.  Cardiovascular: Negative for palpitations.  Gastrointestinal: Negative for abdominal pain, nausea and vomiting.       Negative for changes in bowel habits.  Genitourinary: Negative for decreased urine volume, dysuria and hematuria.  Skin: Negative for pallor and rash.  Allergic/Immunologic: Positive for environmental allergies.  Neurological: Negative for syncope and facial asymmetry.  Psychiatric/Behavioral: Negative for confusion (no more than usual). The patient is nervous/anxious.   Rest see pertinent positives and negatives per HPI.   Current Outpatient Medications on File Prior to Visit  Medication Sig Dispense Refill  . aspirin 81 MG tablet Take 81 mg by mouth daily.    . calcium citrate-vitamin D 500-400 MG-UNIT chewable tablet Chew 1 tablet by mouth daily.    . cetirizine (ZYRTEC) 10 MG tablet Take 1 tablet (10 mg total) by mouth daily. 30 tablet 1  . Cranberry 500 MG CAPS Take 500 mg by mouth daily.    . iron polysaccharides (NIFEREX) 150 MG capsule Take 150 mg by mouth daily.    Marland Kitchen JANUVIA 50 MG tablet TAKE 1 TABLET DAILY 90 tablet 3  . LIVALO 2 MG TABS TAKE 1 TABLET DAILY 90 tablet 4  . metFORMIN (GLUCOPHAGE) 1000 MG tablet TAKE 1 TABLET TWICE A DAY WITH MEALS 180 tablet 4  . mirtazapine (REMERON) 30 MG tablet TAKE  1 TABLET AT BEDTIME 90 tablet 2  . montelukast (SINGULAIR) 10 MG tablet TAKE 1 TABLET AT BEDTIME 90 tablet 3  . SAVELLA 50 MG TABS tablet TAKE 1 TABLET EVERY 12 HOURS 180 tablet 2  . TOPROL XL 50 MG 24 hr tablet TAKE 1 TABLET DAILY WITH OR IMMEDIATELY FOLLOWING A MEAL 90 tablet 3  . vitamin C (ASCORBIC ACID) 500 MG tablet Take 500 mg by mouth daily.    . vitamin E (VITAMIN E) 400 UNIT capsule Take 400 Units by mouth daily.     No current facility-administered  medications on file prior to visit.     Past Medical History:  Diagnosis Date  . Asthma   . Depression   . Diabetes mellitus without complication (HCC)   . Emphysema of lung (HCC)   . Heart disease   . Hyperlipidemia   . Hypertension   . Irritable bowel syndrome    Allergies  Allergen Reactions  . Bactrim [Sulfamethoxazole-Trimethoprim] Hives    Social History   Socioeconomic History  . Marital status: Widowed    Spouse name: Not on file  . Number of children: 1  . Years of education: Not on file  . Highest education level: Not on file  Occupational History  . Not on file  Social Needs  . Financial resource strain: Not on file  . Food insecurity    Worry: Not on file    Inability: Not on file  . Transportation needs    Medical: Not on file    Non-medical: Not on file  Tobacco Use  . Smoking status: Current Every Day Smoker    Types: Cigarettes  . Smokeless tobacco: Never Used  Substance and Sexual Activity  . Alcohol use: Not Currently  . Drug use: Not Currently  . Sexual activity: Not Currently  Lifestyle  . Physical activity    Days per week: Not on file    Minutes per session: Not on file  . Stress: Not on file  Relationships  . Social Musicianconnections    Talks on phone: Not on file    Gets together: Not on file    Attends religious service: Not on file    Active member of club or organization: Not on file    Attends meetings of clubs or organizations: Not on file    Relationship status: Not on file  Other Topics Concern  . Not on file  Social History Narrative  . Not on file    Vitals:   04/02/19 1359  BP: 120/68  Pulse: 93  Resp: 15  Temp: 98.7 F (37.1 C)  SpO2: 93%   Body mass index is 23.2 kg/m.  Physical Exam  Nursing note and vitals reviewed. Constitutional: She appears well-developed. No distress.  HENT:  Head: Normocephalic and atraumatic.  Mouth/Throat: Oropharynx is clear and moist and mucous membranes are normal. Abnormal  dentition.  Eyes: Pupils are equal, round, and reactive to light. Conjunctivae are normal.  Cardiovascular: Normal rate and regular rhythm.  No murmur heard. Pulses:      Dorsalis pedis pulses are 2+ on the right side and 2+ on the left side.  Respiratory: Effort normal and breath sounds normal. No respiratory distress.  GI: Soft. She exhibits no mass. There is no abdominal tenderness.  Musculoskeletal:        General: No edema.  Lymphadenopathy:    She has no cervical adenopathy.  Neurological: She is alert. She has normal strength. No cranial nerve deficit.  Oriented in person and place. Mildly unstable gait, not assisted.  Skin: Skin is warm. No rash noted. No erythema.  Psychiatric: She has a normal mood and affect.  Well groomed, good eye contact.    ASSESSMENT AND PLAN:  Dana [name] was seen today for diabetes.  Diagnoses and all orders for this visit: Lab Results  Component Value Date   HGBA1C 6.5 (A) 04/02/2019   Lab Results  Component Value Date   CREATININE 0.50 04/02/2019   BUN 11 04/02/2019   NA 127 (L) 04/02/2019   K 4.4 04/02/2019   CL 91 (L) 04/02/2019   CO2 28 04/02/2019    Hypertension with heart disease BP adequately controlled. Continue losartan 25 mg daily and Toprol XL 50 mg daily. His son will continue monitoring BP.  -     Basic metabolic panel -     losartan (COZAAR) 25 MG tablet; Take 1 tablet (25 mg total) by mouth daily.  Type 2 diabetes mellitus with diabetic neuropathy, without long-term current use of insulin (HCC) HgA1C at goal. Continue Januvia 50 mg daily and [place] 500 mg twice daily.  Regular exercise as tolerated and healthy diet with avoidance of added sugar food intake is an important part of treatment and recommended. Annual eye exam, periodic dental and foot care recommended. F/U in 5-6 months  -     Microalbumin / creatinine urine ratio -     POCT glycosylated hemoglobin (Hb A1C)  Hyponatremia Continue fluid  restriction for now. Instructed about warning signs. Some of her medications could be contributing to problem but for now no changes.  -     Basic metabolic panel  Allergic rhinitis, unspecified seasonality, unspecified trigger We discussed some side effects of antihistamines and the risk for complications given her age. Her son Scientist, research (physical sciences)(pharmacist) states that this is the only regimen that has helped her with symptoms.  No changes in current management.  -     desloratadine (CLARINEX) 5 MG tablet; Take 1 tablet (5 mg total) by mouth daily.  Major depressive disorder in remission, unspecified whether recurrent (HCC) Problem is is stable. Continue mirtazapine 30 mg daily.  Fibromyalgia Well controlled with Savella 50 mg twice daily. No changes in current management. Low impact exercise and good sleep hygiene recommended.  Microalbuminuria due to type 2 diabetes mellitus (HCC) Adequate glucose and BP controlled. Will continue following. CKD II. Avoid NSAID's and continue low salt diet. Return in about 6 months (around 10/03/2019) for virtual vist..     Sherial Ebrahim G. [place], MD  [facility]. [place] office.

## 2019-04-04 DIAGNOSIS — N182 Chronic kidney disease, stage 2 (mild): Secondary | ICD-10-CM | POA: Insufficient documentation

## 2019-04-04 DIAGNOSIS — E1129 Type 2 diabetes mellitus with other diabetic kidney complication: Secondary | ICD-10-CM | POA: Insufficient documentation

## 2019-04-04 DIAGNOSIS — R809 Proteinuria, unspecified: Secondary | ICD-10-CM | POA: Insufficient documentation

## 2019-04-23 ENCOUNTER — Other Ambulatory Visit: Payer: Self-pay

## 2019-04-23 ENCOUNTER — Other Ambulatory Visit (INDEPENDENT_AMBULATORY_CARE_PROVIDER_SITE_OTHER): Payer: Medicare Other

## 2019-04-23 DIAGNOSIS — E871 Hypo-osmolality and hyponatremia: Secondary | ICD-10-CM

## 2019-04-23 LAB — COMPREHENSIVE METABOLIC PANEL
ALT: 13 U/L (ref 0–35)
AST: 14 U/L (ref 0–37)
Albumin: 4.4 g/dL (ref 3.5–5.2)
Alkaline Phosphatase: 82 U/L (ref 39–117)
BUN: 11 mg/dL (ref 6–23)
CO2: 28 mEq/L (ref 19–32)
Calcium: 9.8 mg/dL (ref 8.4–10.5)
Chloride: 92 mEq/L — ABNORMAL LOW (ref 96–112)
Creatinine, Ser: 0.53 mg/dL (ref 0.40–1.20)
GFR: 112.58 mL/min (ref >60.00)
Glucose, Bld: 156 mg/dL — ABNORMAL HIGH (ref 70–99)
Potassium: 4.4 mEq/L (ref 3.5–5.1)
Sodium: 129 mEq/L — ABNORMAL LOW (ref 135–145)
Total Bilirubin: 0.7 mg/dL (ref 0.2–1.2)
Total Protein: 7 g/dL (ref 6.0–8.3)

## 2019-04-23 LAB — TSH: TSH: 2.68 u[IU]/mL (ref 0.35–4.50)

## 2019-04-26 LAB — OSMOLALITY: Osmolality: 272 mOsm/kg — ABNORMAL LOW (ref 278–305)

## 2019-04-26 LAB — SODIUM, URINE, RANDOM

## 2019-04-26 LAB — OSMOLALITY, URINE

## 2019-05-02 LAB — SODIUM, URINE, RANDOM: Sodium, Ur: 46 mmol/L (ref 28–272)

## 2019-05-02 LAB — OSMOLALITY, URINE: Osmolality, Ur: 216 mOsm/kg (ref 50–1200)

## 2019-06-28 ENCOUNTER — Other Ambulatory Visit: Payer: Self-pay | Admitting: Family Medicine

## 2019-06-28 DIAGNOSIS — M797 Fibromyalgia: Secondary | ICD-10-CM

## 2019-06-28 DIAGNOSIS — F325 Major depressive disorder, single episode, in full remission: Secondary | ICD-10-CM

## 2019-09-26 ENCOUNTER — Other Ambulatory Visit: Payer: Self-pay | Admitting: Family Medicine

## 2019-09-26 DIAGNOSIS — E1140 Type 2 diabetes mellitus with diabetic neuropathy, unspecified: Secondary | ICD-10-CM

## 2019-11-13 ENCOUNTER — Other Ambulatory Visit: Payer: Self-pay | Admitting: Family Medicine

## 2019-11-13 DIAGNOSIS — E1140 Type 2 diabetes mellitus with diabetic neuropathy, unspecified: Secondary | ICD-10-CM

## 2019-12-10 ENCOUNTER — Other Ambulatory Visit: Payer: Self-pay | Admitting: Family Medicine

## 2019-12-10 DIAGNOSIS — I119 Hypertensive heart disease without heart failure: Secondary | ICD-10-CM

## 2019-12-19 ENCOUNTER — Other Ambulatory Visit: Payer: Self-pay

## 2019-12-20 ENCOUNTER — Ambulatory Visit (INDEPENDENT_AMBULATORY_CARE_PROVIDER_SITE_OTHER): Payer: Medicare Other | Admitting: Family Medicine

## 2019-12-20 ENCOUNTER — Encounter: Payer: Self-pay | Admitting: Family Medicine

## 2019-12-20 VITALS — BP 122/60 | HR 76 | Temp 97.2°F | Resp 16 | Ht 61.0 in | Wt 125.4 lb

## 2019-12-20 DIAGNOSIS — N182 Chronic kidney disease, stage 2 (mild): Secondary | ICD-10-CM | POA: Diagnosis not present

## 2019-12-20 DIAGNOSIS — F325 Major depressive disorder, single episode, in full remission: Secondary | ICD-10-CM | POA: Diagnosis not present

## 2019-12-20 DIAGNOSIS — E1140 Type 2 diabetes mellitus with diabetic neuropathy, unspecified: Secondary | ICD-10-CM

## 2019-12-20 DIAGNOSIS — I119 Hypertensive heart disease without heart failure: Secondary | ICD-10-CM | POA: Diagnosis not present

## 2019-12-20 DIAGNOSIS — E1169 Type 2 diabetes mellitus with other specified complication: Secondary | ICD-10-CM

## 2019-12-20 DIAGNOSIS — D509 Iron deficiency anemia, unspecified: Secondary | ICD-10-CM

## 2019-12-20 DIAGNOSIS — J439 Emphysema, unspecified: Secondary | ICD-10-CM | POA: Diagnosis not present

## 2019-12-20 DIAGNOSIS — E785 Hyperlipidemia, unspecified: Secondary | ICD-10-CM

## 2019-12-20 LAB — BASIC METABOLIC PANEL
BUN: 14 mg/dL (ref 6–23)
CO2: 28 mEq/L (ref 19–32)
Calcium: 10.1 mg/dL (ref 8.4–10.5)
Chloride: 92 mEq/L — ABNORMAL LOW (ref 96–112)
Creatinine, Ser: 0.58 mg/dL (ref 0.40–1.20)
GFR: 101.28 mL/min (ref >60.00)
Glucose, Bld: 193 mg/dL — ABNORMAL HIGH (ref 70–99)
Potassium: 5.1 mEq/L (ref 3.5–5.1)
Sodium: 128 mEq/L — ABNORMAL LOW (ref 135–145)

## 2019-12-20 LAB — LIPID PANEL
Cholesterol: 109 mg/dL (ref 0–200)
HDL: 35.9 mg/dL — ABNORMAL LOW (ref >39.00)
LDL Cholesterol: 54 mg/dL (ref 0–99)
NonHDL: 73.36
Total CHOL/HDL Ratio: 3
Triglycerides: 99 mg/dL (ref 0.0–149.0)
VLDL: 19.8 mg/dL (ref 0.0–40.0)

## 2019-12-20 LAB — CBC
HCT: 41.4 % (ref 36.0–46.0)
Hemoglobin: 14.2 g/dL (ref 12.0–15.0)
MCHC: 34.3 g/dL (ref 30.0–36.0)
MCV: 94.8 fl (ref 78.0–100.0)
Platelets: 264 10*3/uL (ref 150.0–400.0)
RBC: 4.36 Mil/uL (ref 3.87–5.11)
RDW: 14.5 % (ref 11.5–15.5)
WBC: 7.2 10*3/uL (ref 4.0–10.5)

## 2019-12-20 LAB — MICROALBUMIN / CREATININE URINE RATIO
Creatinine,U: 14.7 mg/dL
Microalb Creat Ratio: 94.7 mg/g — ABNORMAL HIGH (ref 0.0–30.0)
Microalb, Ur: 13.9 mg/dL — ABNORMAL HIGH (ref 0.0–1.9)

## 2019-12-20 LAB — FERRITIN: Ferritin: 89 ng/mL (ref 10.0–291.0)

## 2019-12-20 LAB — HEMOGLOBIN A1C: Hgb A1c MFr Bld: 6.6 % — ABNORMAL HIGH (ref 4.6–6.5)

## 2019-12-20 NOTE — Assessment & Plan Note (Signed)
Continue daily iron supplementation. Further recommendation will be given according to CBC results.

## 2019-12-20 NOTE — Assessment & Plan Note (Signed)
Otherwise problem is a stable, in general she feels like mirtazapine is helping.  So no changes in current management.

## 2019-12-20 NOTE — Assessment & Plan Note (Signed)
BP is adequately controlled. Continue losartan 25 mg daily. Low-salt diet. She is overdue for eye exam.

## 2019-12-20 NOTE — Progress Notes (Signed)
Chief Complaint  Patient presents with  . Follow-up    6 month follow-up   HPI:  Dana [name] is a 73 y.o. female, who is here today for 6 months follow up.   She was last seen on 04/02/2019. No new problems since her last visit. COPD/emphysema: In the past she has been on Symbicort.  She denies cough, wheezing, or exertional dyspnea. She takes antihistaminics for seasonal allergies. + Smoker, not interested in smoking cessation.  DM2:Dx'ed in the 1980's.  She is not checking BS at home. Currently she is on Januvia 50 mg daily and Metformin 1000 mg twice daily. Negative for polydipsia,polyuria, or polyphagia.  Lab Results  Component Value Date   HGBA1C 6.5 (A) 04/02/2019   Lab Results  Component Value Date   MICROALBUR 12.7 (H) 04/02/2019   MICROALBUR 16.0 (H) 07/02/2018   Negative for decreased urine output or form in urine.  Hypertension: Dx'ed in the 1980's. Currently she is on losartan 25 mg daily and metoprolol succinate 50 mg daily. Negative for severe/frequent headache, visual changes, chest pain, dyspnea,focal weakness, or edema.  Lab Results  Component Value Date   CREATININE 0.53 04/23/2019   BUN 11 04/23/2019   NA 129 (L) 04/23/2019   K 4.4 04/23/2019   CL 92 (L) 04/23/2019   CO2 28 04/23/2019  HypoNa++: She drinks 4-5 cups of coffee, some water, and ice high few times throughout the day. Negative for MS changes, abdominal pain, or changes in bowel habits. History of iron deficiency anemia. Currently she is on iron polysaccharides 150 mg daily. She has not noted nose/gum bleeding, blood in the stool, melena, or gross hematuria.  Depression: Currently she is on mirtazapine 30 mg at bedtime. She states that medication has helped. Symptoms have been stable. She denies suicidal ideation or having a plan. She states that she will "never" do something against her own life because she thinks of her son.  Review of Systems  Constitutional:  Positive for fatigue. Negative for activity change, appetite change and fever.  HENT: Negative for mouth sores, nosebleeds and sore throat.   Cardiovascular: Negative for palpitations.  Gastrointestinal: Negative for abdominal pain, nausea and vomiting.       Negative for changes in bowel habits.  Genitourinary: Negative for dysuria.  Musculoskeletal: Positive for arthralgias, back pain and myalgias. Negative for gait problem.  Allergic/Immunologic: Positive for environmental allergies.  Neurological: Negative for syncope, facial asymmetry and weakness.  Psychiatric/Behavioral: Negative for confusion and hallucinations.  Rest of ROS, see pertinent positives sand negatives in HPI  Current Outpatient Medications on File Prior to Visit  Medication Sig Dispense Refill  . aspirin 81 MG tablet Take 81 mg by mouth daily.    . calcium citrate-vitamin D 500-400 MG-UNIT chewable tablet Chew 1 tablet by mouth daily.    . cetirizine (ZYRTEC) 10 MG tablet Take 1 tablet (10 mg total) by mouth daily. 30 tablet 1  . Cranberry 500 MG CAPS Take 500 mg by mouth daily.    Marland Kitchen desloratadine (CLARINEX) 5 MG tablet Take 1 tablet (5 mg total) by mouth daily. 90 tablet 2  . iron polysaccharides (NIFEREX) 150 MG capsule Take 150 mg by mouth daily.    Marland Kitchen JANUVIA 50 MG tablet TAKE 1 TABLET DAILY 90 tablet 3  . LIVALO 2 MG TABS TAKE 1 TABLET DAILY 90 tablet 4  . losartan (COZAAR) 25 MG tablet TAKE 1 TABLET DAILY 90 tablet 3  . metFORMIN (GLUCOPHAGE) 1000 MG  tablet TAKE 1 TABLET TWICE A DAY WITH MEALS 180 tablet 2  . mirtazapine (REMERON) 30 MG tablet TAKE 1 TABLET AT BEDTIME 90 tablet 3  . montelukast (SINGULAIR) 10 MG tablet TAKE 1 TABLET AT BEDTIME 90 tablet 3  . SAVELLA 50 MG TABS tablet TAKE 1 TABLET EVERY 12 HOURS 180 tablet 3  . TOPROL XL 50 MG 24 hr tablet TAKE 1 TABLET DAILY WITH OR IMMEDIATELY FOLLOWING A MEAL 90 tablet 3  . vitamin C (ASCORBIC ACID) 500 MG tablet Take 500 mg by mouth daily.    . vitamin E  (VITAMIN E) 400 UNIT capsule Take 400 Units by mouth daily.     No current facility-administered medications on file prior to visit.   Past Medical History:  Diagnosis Date  . Asthma   . Depression   . Diabetes mellitus without complication (HCC)   . Emphysema of lung (HCC)   . Heart disease   . Hyperlipidemia   . Hypertension   . Irritable bowel syndrome    Allergies  Allergen Reactions  . Bactrim [Sulfamethoxazole-Trimethoprim] Hives   Social History   Socioeconomic History  . Marital status: Widowed    Spouse name: Not on file  . Number of children: 1  . Years of education: Not on file  . Highest education level: Not on file  Occupational History  . Not on file  Tobacco Use  . Smoking status: Current Every Day Smoker    Types: Cigarettes  . Smokeless tobacco: Never Used  Substance and Sexual Activity  . Alcohol use: Not Currently  . Drug use: Not Currently  . Sexual activity: Not Currently  Other Topics Concern  . Not on file  Social History Narrative  . Not on file   Social Determinants of Health   Financial Resource Strain:   . Difficulty of Paying Living Expenses:   Food Insecurity:   . Worried About Programme researcher, broadcasting/film/video in the Last Year:   . Barista in the Last Year:   Transportation Needs:   . Freight forwarder (Medical):   Marland Kitchen Lack of Transportation (Non-Medical):   Physical Activity:   . Days of Exercise per Week:   . Minutes of Exercise per Session:   Stress:   . Feeling of Stress :   Social Connections:   . Frequency of Communication with Friends and Family:   . Frequency of Social Gatherings with Friends and Family:   . Attends Religious Services:   . Active Member of Clubs or Organizations:   . Attends Banker Meetings:   Marland Kitchen Marital Status:     Vitals:   12/20/19 1143  BP: 122/60  Pulse: 76  Resp: 16  Temp: (!) 97.2 F (36.2 C)  SpO2: 93%   Body mass index is 23.69 kg/m.  Physical Exam  Nursing note and  vitals reviewed. Constitutional: She is oriented to person, place, and time. She appears well-developed and well-nourished. No distress.  HENT:  Head: Normocephalic and atraumatic.  Mouth/Throat: Oropharynx is clear and moist and mucous membranes are normal.  Missing teeth.  Eyes: Pupils are equal, round, and reactive to light. Conjunctivae are normal.  Cardiovascular: Normal rate and regular rhythm.  No murmur heard. Pulses:      Dorsalis pedis pulses are 2+ on the right side and 2+ on the left side.  Respiratory: Effort normal and breath sounds normal. No respiratory distress.  GI: Soft. She exhibits no mass. There is no abdominal  tenderness.  Musculoskeletal:        General: Edema (Trace pitting LE edema, bilateral.) present.  Lymphadenopathy:    She has no cervical adenopathy.  Neurological: She is alert and oriented to person, place, and time. She has normal strength.  Stable gait with no assistance.  Skin: Skin is warm. No rash noted. No erythema.  Psychiatric: She has a normal mood and affect. She expresses no suicidal ideation. She expresses no suicidal plans.  Well groomed, good eye contact.   ASSESSMENT AND PLAN:   Ms. Dana [name] was seen today for 6 months follow-up.  Orders Placed This Encounter  Procedures  . Ferritin  . CBC  . Basic metabolic panel  . Microalbumin / creatinine urine ratio  . Hemoglobin A1c  . Fructosamine  . Lipid panel   Lab Results  Component Value Date   HGBA1C 6.6 (H) 12/20/2019   Lab Results  Component Value Date   CHOL 109 12/20/2019   HDL 35.90 (L) 12/20/2019   LDLCALC 54 12/20/2019   TRIG 99.0 12/20/2019   CHOLHDL 3 12/20/2019    Lab Results  Component Value Date   MICROALBUR 13.9 (H) 12/20/2019   MICROALBUR 12.7 (H) 04/02/2019   Lab Results  Component Value Date   WBC 7.2 12/20/2019   HGB 14.2 12/20/2019   HCT 41.4 12/20/2019   MCV 94.8 12/20/2019   PLT 264.0 12/20/2019    Lab Results  Component Value  Date   CREATININE 0.58 12/20/2019   BUN 14 12/20/2019   NA 128 (L) 12/20/2019   K 5.1 12/20/2019   CL 92 (L) 12/20/2019   CO2 28 12/20/2019    CKD (chronic kidney disease), stage II Microalbuminuria gradually getting worse. Recommend with NSAIDs. Adequate BP and glucose control. Further recommendation will be given according to lab results.  COPD (chronic obstructive pulmonary disease) (HCC) Problem seems to be well controlled with nonpharmacologic treatment. Encourage smoking cessation.  Hyperlipidemia associated with type 2 diabetes mellitus (HCC) Continue Livalo 2 mg daily. Low-fat diet is also recommended. Further recommendations according to lipid panel results.   Hypertension with heart disease BP is adequately controlled. Continue losartan 25 mg daily. Low-salt diet. She is overdue for eye exam.  Major depressive disorder in remission [facility]) Otherwise problem is a stable, in general she feels like mirtazapine is helping.  So no changes in current management.   Type 2 diabetes mellitus with diabetic neuropathy, without long-term current use of insulin (HCC) HgA1C has been at goal. No changes in current management. Regular exercise as tolerated and healthy diet with avoidance of added sugar food intake is an important part of treatment and recommended. Annual eye exam and foot care recommended. F/U in 5-6 months   Iron deficiency anemia Continue daily iron supplementation. Further recommendation will be given according to CBC results.   Return in about 6 months (around 06/21/2020).   Gaynelle Pastrana G. [place], MD  [place]. [place] [facility].  A few things to remember from today's visit:   No changes today. Avoid drinking just water, mix it with gatorade 1:1.   If you need refills please call your pharmacy. Do not use My Chart to request refills or for acute issues that need immediate attention.    Please be sure medication list is accurate. If  a new problem present, please set up appointment sooner than planned today.

## 2019-12-20 NOTE — Patient Instructions (Signed)
A few things to remember from today's visit:   No changes today. Avoid drinking just water, mix it with gatorade 1:1.   If you need refills please call your pharmacy. Do not use My Chart to request refills or for acute issues that need immediate attention.    Please be sure medication list is accurate. If a new problem present, please set up appointment sooner than planned today.

## 2019-12-20 NOTE — Assessment & Plan Note (Signed)
HgA1C has been at goal. No changes in current management. Regular exercise as tolerated and healthy diet with avoidance of added sugar food intake is an important part of treatment and recommended. Annual eye exam and foot care recommended. F/U in 5-6 months

## 2019-12-20 NOTE — Assessment & Plan Note (Signed)
Microalbuminuria gradually getting worse. Recommend with NSAIDs. Adequate BP and glucose control. Further recommendation will be given according to lab results.

## 2019-12-20 NOTE — Assessment & Plan Note (Signed)
Problem seems to be well controlled with nonpharmacologic treatment. Encourage smoking cessation.

## 2019-12-20 NOTE — Assessment & Plan Note (Signed)
Continue Livalo 2 mg daily. Low-fat diet is also recommended. Further recommendations according to lipid panel results.

## 2019-12-21 ENCOUNTER — Other Ambulatory Visit: Payer: Self-pay | Admitting: Family Medicine

## 2019-12-21 DIAGNOSIS — I119 Hypertensive heart disease without heart failure: Secondary | ICD-10-CM

## 2019-12-24 LAB — FRUCTOSAMINE: Fructosamine: 351 umol/L — ABNORMAL HIGH (ref 205–285)

## 2019-12-28 ENCOUNTER — Other Ambulatory Visit: Payer: Self-pay | Admitting: Family Medicine

## 2019-12-28 DIAGNOSIS — J309 Allergic rhinitis, unspecified: Secondary | ICD-10-CM

## 2019-12-29 ENCOUNTER — Other Ambulatory Visit: Payer: Self-pay | Admitting: Family Medicine

## 2019-12-29 DIAGNOSIS — E785 Hyperlipidemia, unspecified: Secondary | ICD-10-CM

## 2020-03-11 ENCOUNTER — Other Ambulatory Visit: Payer: Self-pay

## 2020-03-11 ENCOUNTER — Emergency Department (HOSPITAL_COMMUNITY): Payer: Medicare Other

## 2020-03-11 ENCOUNTER — Inpatient Hospital Stay (HOSPITAL_COMMUNITY)
Admission: EM | Admit: 2020-03-11 | Discharge: 2020-03-18 | DRG: 085 | Disposition: A | Discharge status: Hospice | Payer: Medicare Other | Attending: Internal Medicine | Admitting: Internal Medicine

## 2020-03-11 ENCOUNTER — Encounter (HOSPITAL_COMMUNITY): Payer: Self-pay

## 2020-03-11 ENCOUNTER — Ambulatory Visit (HOSPITAL_COMMUNITY)
Admission: EM | Admit: 2020-03-11 | Discharge: 2020-03-11 | Disposition: A | Discharge status: Home / Self Care | Payer: Medicare Other

## 2020-03-11 DIAGNOSIS — J9601 Acute respiratory failure with hypoxia: Secondary | ICD-10-CM | POA: Diagnosis not present

## 2020-03-11 DIAGNOSIS — Z4659 Encounter for fitting and adjustment of other gastrointestinal appliance and device: Secondary | ICD-10-CM

## 2020-03-11 DIAGNOSIS — N179 Acute kidney failure, unspecified: Secondary | ICD-10-CM | POA: Diagnosis not present

## 2020-03-11 DIAGNOSIS — R54 Age-related physical debility: Secondary | ICD-10-CM | POA: Diagnosis present

## 2020-03-11 DIAGNOSIS — M797 Fibromyalgia: Secondary | ICD-10-CM | POA: Diagnosis present

## 2020-03-11 DIAGNOSIS — Z20822 Contact with and (suspected) exposure to covid-19: Secondary | ICD-10-CM | POA: Diagnosis not present

## 2020-03-11 DIAGNOSIS — E1122 Type 2 diabetes mellitus with diabetic chronic kidney disease: Secondary | ICD-10-CM | POA: Diagnosis present

## 2020-03-11 DIAGNOSIS — I609 Nontraumatic subarachnoid hemorrhage, unspecified: Secondary | ICD-10-CM

## 2020-03-11 DIAGNOSIS — Z881 Allergy status to other antibiotic agents status: Secondary | ICD-10-CM

## 2020-03-11 DIAGNOSIS — Z7982 Long term (current) use of aspirin: Secondary | ICD-10-CM

## 2020-03-11 DIAGNOSIS — F325 Major depressive disorder, single episode, in full remission: Secondary | ICD-10-CM | POA: Diagnosis present

## 2020-03-11 DIAGNOSIS — I050 Rheumatic mitral stenosis: Secondary | ICD-10-CM | POA: Diagnosis present

## 2020-03-11 DIAGNOSIS — J441 Chronic obstructive pulmonary disease with (acute) exacerbation: Secondary | ICD-10-CM

## 2020-03-11 DIAGNOSIS — E871 Hypo-osmolality and hyponatremia: Secondary | ICD-10-CM | POA: Diagnosis present

## 2020-03-11 DIAGNOSIS — W19XXXA Unspecified fall, initial encounter: Secondary | ICD-10-CM | POA: Diagnosis not present

## 2020-03-11 DIAGNOSIS — Z79899 Other long term (current) drug therapy: Secondary | ICD-10-CM

## 2020-03-11 DIAGNOSIS — G9341 Metabolic encephalopathy: Secondary | ICD-10-CM | POA: Diagnosis present

## 2020-03-11 DIAGNOSIS — Z818 Family history of other mental and behavioral disorders: Secondary | ICD-10-CM

## 2020-03-11 DIAGNOSIS — J90 Pleural effusion, not elsewhere classified: Secondary | ICD-10-CM | POA: Diagnosis not present

## 2020-03-11 DIAGNOSIS — Z6823 Body mass index (BMI) 23.0-23.9, adult: Secondary | ICD-10-CM

## 2020-03-11 DIAGNOSIS — S3993XA Unspecified injury of pelvis, initial encounter: Secondary | ICD-10-CM | POA: Diagnosis not present

## 2020-03-11 DIAGNOSIS — D509 Iron deficiency anemia, unspecified: Secondary | ICD-10-CM | POA: Diagnosis present

## 2020-03-11 DIAGNOSIS — S066X0A Traumatic subarachnoid hemorrhage without loss of consciousness, initial encounter: Secondary | ICD-10-CM | POA: Diagnosis not present

## 2020-03-11 DIAGNOSIS — I952 Hypotension due to drugs: Secondary | ICD-10-CM | POA: Diagnosis not present

## 2020-03-11 DIAGNOSIS — E875 Hyperkalemia: Secondary | ICD-10-CM | POA: Diagnosis present

## 2020-03-11 DIAGNOSIS — Y92009 Unspecified place in unspecified non-institutional (private) residence as the place of occurrence of the external cause: Secondary | ICD-10-CM

## 2020-03-11 DIAGNOSIS — J690 Pneumonitis due to inhalation of food and vomit: Secondary | ICD-10-CM | POA: Diagnosis not present

## 2020-03-11 DIAGNOSIS — F17210 Nicotine dependence, cigarettes, uncomplicated: Secondary | ICD-10-CM | POA: Diagnosis present

## 2020-03-11 DIAGNOSIS — E1165 Type 2 diabetes mellitus with hyperglycemia: Secondary | ICD-10-CM | POA: Diagnosis not present

## 2020-03-11 DIAGNOSIS — Z7984 Long term (current) use of oral hypoglycemic drugs: Secondary | ICD-10-CM

## 2020-03-11 DIAGNOSIS — E785 Hyperlipidemia, unspecified: Secondary | ICD-10-CM | POA: Diagnosis present

## 2020-03-11 DIAGNOSIS — Z66 Do not resuscitate: Secondary | ICD-10-CM | POA: Diagnosis not present

## 2020-03-11 DIAGNOSIS — Z8249 Family history of ischemic heart disease and other diseases of the circulatory system: Secondary | ICD-10-CM

## 2020-03-11 DIAGNOSIS — R402252 Coma scale, best verbal response, oriented, at arrival to emergency department: Secondary | ICD-10-CM | POA: Diagnosis present

## 2020-03-11 DIAGNOSIS — J439 Emphysema, unspecified: Secondary | ICD-10-CM | POA: Diagnosis not present

## 2020-03-11 DIAGNOSIS — N182 Chronic kidney disease, stage 2 (mild): Secondary | ICD-10-CM | POA: Diagnosis present

## 2020-03-11 DIAGNOSIS — S0993XA Unspecified injury of face, initial encounter: Secondary | ICD-10-CM | POA: Diagnosis not present

## 2020-03-11 DIAGNOSIS — E872 Acidosis: Secondary | ICD-10-CM | POA: Diagnosis present

## 2020-03-11 DIAGNOSIS — Z515 Encounter for palliative care: Secondary | ICD-10-CM | POA: Diagnosis not present

## 2020-03-11 DIAGNOSIS — I4891 Unspecified atrial fibrillation: Secondary | ICD-10-CM

## 2020-03-11 DIAGNOSIS — S0181XA Laceration without foreign body of other part of head, initial encounter: Secondary | ICD-10-CM | POA: Diagnosis present

## 2020-03-11 DIAGNOSIS — Z978 Presence of other specified devices: Secondary | ICD-10-CM

## 2020-03-11 DIAGNOSIS — Z83438 Family history of other disorder of lipoprotein metabolism and other lipidemia: Secondary | ICD-10-CM

## 2020-03-11 DIAGNOSIS — T461X5A Adverse effect of calcium-channel blockers, initial encounter: Secondary | ICD-10-CM | POA: Diagnosis not present

## 2020-03-11 DIAGNOSIS — F17200 Nicotine dependence, unspecified, uncomplicated: Secondary | ICD-10-CM | POA: Diagnosis present

## 2020-03-11 DIAGNOSIS — Y92230 Patient room in hospital as the place of occurrence of the external cause: Secondary | ICD-10-CM | POA: Diagnosis not present

## 2020-03-11 DIAGNOSIS — S199XXA Unspecified injury of neck, initial encounter: Secondary | ICD-10-CM | POA: Diagnosis not present

## 2020-03-11 DIAGNOSIS — J9600 Acute respiratory failure, unspecified whether with hypoxia or hypercapnia: Secondary | ICD-10-CM

## 2020-03-11 DIAGNOSIS — I130 Hypertensive heart and chronic kidney disease with heart failure and stage 1 through stage 4 chronic kidney disease, or unspecified chronic kidney disease: Secondary | ICD-10-CM | POA: Diagnosis present

## 2020-03-11 DIAGNOSIS — T380X5A Adverse effect of glucocorticoids and synthetic analogues, initial encounter: Secondary | ICD-10-CM | POA: Diagnosis present

## 2020-03-11 DIAGNOSIS — J9602 Acute respiratory failure with hypercapnia: Secondary | ICD-10-CM | POA: Diagnosis not present

## 2020-03-11 DIAGNOSIS — I517 Cardiomegaly: Secondary | ICD-10-CM | POA: Diagnosis not present

## 2020-03-11 DIAGNOSIS — Z7189 Other specified counseling: Secondary | ICD-10-CM

## 2020-03-11 DIAGNOSIS — R402142 Coma scale, eyes open, spontaneous, at arrival to emergency department: Secondary | ICD-10-CM | POA: Diagnosis not present

## 2020-03-11 DIAGNOSIS — R402362 Coma scale, best motor response, obeys commands, at arrival to emergency department: Secondary | ICD-10-CM | POA: Diagnosis present

## 2020-03-11 DIAGNOSIS — E876 Hypokalemia: Secondary | ICD-10-CM | POA: Diagnosis present

## 2020-03-11 DIAGNOSIS — J9801 Acute bronchospasm: Secondary | ICD-10-CM | POA: Diagnosis not present

## 2020-03-11 DIAGNOSIS — I480 Paroxysmal atrial fibrillation: Secondary | ICD-10-CM | POA: Diagnosis present

## 2020-03-11 DIAGNOSIS — R64 Cachexia: Secondary | ICD-10-CM | POA: Diagnosis present

## 2020-03-11 DIAGNOSIS — I5031 Acute diastolic (congestive) heart failure: Secondary | ICD-10-CM | POA: Diagnosis not present

## 2020-03-11 DIAGNOSIS — E1140 Type 2 diabetes mellitus with diabetic neuropathy, unspecified: Secondary | ICD-10-CM | POA: Diagnosis not present

## 2020-03-11 DIAGNOSIS — Z720 Tobacco use: Secondary | ICD-10-CM

## 2020-03-11 DIAGNOSIS — R296 Repeated falls: Secondary | ICD-10-CM | POA: Diagnosis present

## 2020-03-11 DIAGNOSIS — R627 Adult failure to thrive: Secondary | ICD-10-CM | POA: Diagnosis present

## 2020-03-11 DIAGNOSIS — I959 Hypotension, unspecified: Secondary | ICD-10-CM

## 2020-03-11 LAB — COMPREHENSIVE METABOLIC PANEL
ALT: 21 U/L (ref 0–44)
AST: 20 U/L (ref 15–41)
Albumin: 4.4 g/dL (ref 3.5–5.0)
Alkaline Phosphatase: 86 U/L (ref 38–126)
Anion gap: 10 (ref 5–15)
BUN: 21 mg/dL (ref 8–23)
CO2: 22 mmol/L (ref 22–32)
Calcium: 9.5 mg/dL (ref 8.9–10.3)
Chloride: 87 mmol/L — ABNORMAL LOW (ref 98–111)
Creatinine, Ser: 0.61 mg/dL (ref 0.44–1.00)
GFR calc Af Amer: >60 mL/min (ref >60)
GFR calc non Af Amer: >60 mL/min (ref >60)
Glucose, Bld: 220 mg/dL — ABNORMAL HIGH (ref 70–99)
Potassium: 5.8 mmol/L — ABNORMAL HIGH (ref 3.5–5.1)
Sodium: 119 mmol/L — CL (ref 135–145)
Total Bilirubin: 1 mg/dL (ref 0.3–1.2)
Total Protein: 7.5 g/dL (ref 6.5–8.1)

## 2020-03-11 LAB — CBC WITH DIFFERENTIAL/PLATELET
Abs Immature Granulocytes: 0.02 10*3/uL (ref 0.00–0.07)
Basophils Absolute: 0 10*3/uL (ref 0.0–0.1)
Basophils Relative: 0 %
Eosinophils Absolute: 0 10*3/uL (ref 0.0–0.5)
Eosinophils Relative: 1 %
HCT: 43.5 % (ref 36.0–46.0)
Hemoglobin: 15 g/dL (ref 12.0–15.0)
Immature Granulocytes: 0 %
Lymphocytes Relative: 13 %
Lymphs Abs: 1.1 10*3/uL (ref 0.7–4.0)
MCH: 32.2 pg (ref 26.0–34.0)
MCHC: 34.5 g/dL (ref 30.0–36.0)
MCV: 93.3 fL (ref 80.0–100.0)
Monocytes Absolute: 0.6 10*3/uL (ref 0.1–1.0)
Monocytes Relative: 7 %
Neutro Abs: 6.5 10*3/uL (ref 1.7–7.7)
Neutrophils Relative %: 79 %
Platelets: 308 10*3/uL (ref 150–400)
RBC: 4.66 MIL/uL (ref 3.87–5.11)
RDW: 13.4 % (ref 11.5–15.5)
WBC: 8.3 10*3/uL (ref 4.0–10.5)
nRBC: 0 % (ref 0.0–0.2)

## 2020-03-11 LAB — SARS CORONAVIRUS 2 BY RT PCR (HOSPITAL ORDER, PERFORMED IN ~~LOC~~ HOSPITAL LAB): SARS Coronavirus 2: NEGATIVE

## 2020-03-11 LAB — CBG MONITORING, ED: Glucose-Capillary: 219 mg/dL — ABNORMAL HIGH (ref 70–99)

## 2020-03-11 MED ORDER — DILTIAZEM LOAD VIA INFUSION
20.0000 mg | Freq: Once | INTRAVENOUS | Status: AC
  Administered 2020-03-11: 20 mg via INTRAVENOUS
  Filled 2020-03-11: qty 20

## 2020-03-11 MED ORDER — SODIUM POLYSTYRENE SULFONATE 15 GM/60ML PO SUSP
15.0000 g | Freq: Once | ORAL | Status: AC
  Administered 2020-03-12: 15 g via ORAL
  Filled 2020-03-11: qty 60

## 2020-03-11 MED ORDER — DILTIAZEM HCL-DEXTROSE 125-5 MG/125ML-% IV SOLN (PREMIX)
5.0000 mg/h | INTRAVENOUS | Status: DC
  Administered 2020-03-11: 5 mg/h via INTRAVENOUS
  Filled 2020-03-11: qty 125

## 2020-03-11 MED ORDER — NICOTINE 21 MG/24HR TD PT24
21.0000 mg | MEDICATED_PATCH | Freq: Once | TRANSDERMAL | Status: DC
  Administered 2020-03-11: 21 mg via TRANSDERMAL
  Filled 2020-03-11: qty 1

## 2020-03-11 MED ORDER — SODIUM CHLORIDE 0.9 % IV BOLUS
1000.0000 mL | Freq: Once | INTRAVENOUS | Status: AC
  Administered 2020-03-11: 1000 mL via INTRAVENOUS

## 2020-03-11 NOTE — ED Triage Notes (Signed)
Pt presents with complaints of frequent falls. Pt fell on Saturday and since has fallen a couple more times. Son reports he has noticed some slurred speech as well. Reports her LSN is this past Wednesday. No neuro deficits present during intake. Patient has bruising to her face, chin, and left hand from falling. Pt has an laceration on the bottom of her chin from her fall Saturday night.

## 2020-03-11 NOTE — H&P (Signed)
History and Physical    Dana Dana [name] ELF:[id number] DOB: 12/02/1945 DOA: 03/11/2020  PCP: [place], Dana Dana [name]  Patient coming from: Home, Dana [name] at bedside  I have personally briefly reviewed patient's old medical records in [facility]  Chief Complaint: Recurrent fall  HPI: Dana Dana [name] is a 74 y.o. female with medical history significant for COPD, hypertension, type 2 diabetes, hyperlipidemia, chronic kidney disease stage II, iron deficiency anemia, depression and fibromyalgia who presents with concerns of altered mental status/recurrent falls.  Patient reportedly had a fall about 5 days ago.  She lives alone but Dana [name] received a phone call that she had fallen.  When Dana [name] arrived she was sitting up on the floor but had a lot of bleeding coming from a laceration to her chin.  She does not recall why she fell but thinks that is because she is weak.  Daughter-in-law went to bring her groceries today and saw her fall onto her couch and decided to bring her into the ED for evaluation. Dana [name] reports that she chronically has weakness and had brought her a walker in the past but she does not use it.  She does not have history of frequent falls.  She denies feeling any dizziness or lightheadedness.  No chest pain or shortness of breath.  No nausea, vomiting or diarrhea.  Denies any fever.  In the ED, patient was noted to be in atrial fibrillation with RVR with rates up to 130 which is new for her.  She was given diltiazem bolus but infusion was discontinued since she became hypotensive.  Chest x-ray notable for cardiomegaly and vascular congestion.  Dana [name] reports that she has history of mitral valve stenosis diagnosed about 3 to 4 years ago.  CT head was significant for a small frontal subarachnoid hemorrhage.  Neurosurgery has been consulted and recommend repeat CT head in the morning with no surgical intervention.  Will need to hold off on any blood thinner or anticoagulation.  Patient also  noted to be hyponatremic with a corrected sodium of 121.  She appears to be chronically hyponatremic with baseline around 127.  Dana [name] is unsure whether she has decreased p.o. intake but does mention that patient likes to chew a lot of ice.  Patient denies any alcohol use.  Review of Systems:  Constitutional: No Weight Change, No Fever ENT/Mouth: No sore throat, No Rhinorrhea Eyes: No Eye Pain, No Vision Changes Cardiovascular: No Chest Pain, no SOB, No Palpitations Respiratory: No Cough, No Sputum, no Dyspnea  Gastrointestinal: No Nausea, No Vomiting, No Diarrhea, No Constipation, No Pain Genitourinary: no Urinary Incontinence Musculoskeletal: No Arthralgias, No Myalgias Skin: No Skin Lesions, No Pruritus, Neuro: + Weakness, No Numbness,  No Loss of Consciousness, No Syncope Psych: No Anxiety/Panic, No Depression, no decrease appetite Heme/Lymph: No Bruising, No Bleeding   Past Medical History:  Diagnosis Date  . Asthma   . Depression   . Diabetes mellitus without complication (HCC)   . Emphysema of lung (HCC)   . Heart disease   . Hyperlipidemia   . Hypertension   . Irritable bowel syndrome     Past Surgical History:  Procedure Laterality Date  . ABDOMINAL HYSTERECTOMY    . bladder tack    . CHOLECYSTECTOMY     Social History  reports that she has been smoking cigarettes. She has never used smokeless tobacco. She reports previous alcohol use. She reports previous drug use.   Allergies  Allergen Reactions  . Bactrim [Sulfamethoxazole-Trimethoprim] Hives  Family History  Problem Relation Age of Onset  . Heart disease Mother   . Hypertension Mother   . Heart attack Father   . Heart disease Father   . Hyperlipidemia Father   . Hypertension Father   . Hypertension Sister   . Cancer Brother   . Depression Dana [name]   . Heart attack Dana [name]   . Heart disease Dana [name]   . Hyperlipidemia Dana [name]   . Hypertension Dana [name]   . Kidney disease Dana [name]   . Learning disabilities Dana [name]   . Mental  illness Dana [name]   . Heart disease Sister   . Heart attack Sister   . Heart disease Sister   . Heart disease Brother   . Heart attack Brother   . Mental illness Brother   . Heart disease Brother   . Mental illness Brother      Prior to Admission medications   Medication Sig Start Date End Date Taking? Authorizing Provider  aspirin 81 MG tablet Take 81 mg by mouth daily.   Yes Provider, Historical, [name]  calcium citrate-vitamin D 500-400 MG-UNIT chewable tablet Chew 1 tablet by mouth daily.   Yes Provider, Historical, [name]  cetirizine (ZYRTEC) 10 MG tablet Take 1 tablet (10 mg total) by mouth daily. 07/02/18  Yes [place], Dana Dana [name]  Cranberry 500 MG CAPS Take 500 mg by mouth daily.   Yes Provider, Historical, [name]  desloratadine (CLARINEX) 5 MG tablet TAKE 1 TABLET DAILY Patient taking differently: Take 5 mg by mouth daily.  12/30/19  Yes [place], Dana Dana [name]  iron polysaccharides (NIFEREX) 150 MG capsule Take 150 mg by mouth daily.   Yes Provider, Historical, [name]  JANUVIA 50 MG tablet TAKE 1 TABLET DAILY Patient taking differently: Take 50 mg by mouth daily.  11/13/19  Yes [place], Dana Dana [name]  LIVALO 2 MG TABS TAKE 1 TABLET DAILY Patient taking differently: Take 2 mg by mouth daily.  12/31/19  Yes [place], Dana Dana [name]  losartan (COZAAR) 25 MG tablet TAKE 1 TABLET DAILY Patient taking differently: Take 25 mg by mouth daily.  12/10/19  Yes [place], Dana Dana [name]  metFORMIN (GLUCOPHAGE) 1000 MG tablet TAKE 1 TABLET TWICE A DAY WITH MEALS Patient taking differently: Take 1,000 mg by mouth 2 (two) times daily with a meal.  09/27/19  Yes [place], Dana Dana [name]  metoprolol succinate (TOPROL-XL) 50 MG 24 hr tablet TAKE 1 TABLET DAILY WITH OR IMMEDIATELY FOLLOWING A MEAL Patient taking differently: Take 50 mg by mouth daily.  12/23/19  Yes [place], Dana Dana [name]  mirtazapine (REMERON) 30 MG tablet TAKE 1 TABLET AT BEDTIME Patient taking differently: Take 30 mg by mouth at bedtime.  06/28/19  Yes [place], Dana Dana [name]    montelukast (SINGULAIR) 10 MG tablet TAKE 1 TABLET AT BEDTIME Patient taking differently: Take 10 mg by mouth at bedtime.  12/30/19  Yes [place], Dana Dana [name]  SAVELLA 50 MG TABS tablet TAKE 1 TABLET EVERY 12 HOURS Patient taking differently: Take 50 mg by mouth 2 (two) times daily.  06/28/19  Yes [place], Dana Dana [name]  vitamin C (ASCORBIC ACID) 500 MG tablet Take 500 mg by mouth daily.   Yes Provider, Historical, [name]  vitamin E (VITAMIN E) 400 UNIT capsule Take 400 Units by mouth daily.   Yes Provider, Historical, [name]    Physical Exam: Vitals:   03/11/20 2150 03/11/20 2200 03/11/20 2210 03/11/20 2220  BP: (!) 83/62  99/66 101/71  Pulse: 46 72 64 68  Resp: 21 18 18 18   Temp:      SpO2: 93% 95% 95% 95%  Weight:      Height:        Constitutional: NAD, calm, comfortable, thin nontoxic fatigued/drowsy appearing elderly female laying flat in bed Vitals:   03/11/20 2150 03/11/20 2200 03/11/20 2210 03/11/20 2220  BP: (!) 83/62  99/66 101/71  Pulse: 46 72 64 68  Resp: 21 18 18 18   Temp:      SpO2: 93% 95% 95% 95%  Weight:      Height:       Eyes: Pupils not equal reactive to light but has had corneal implants, lids and conjunctivae normal ENMT: Mucous membranes are moist.  Significant bruising/ecchymosis to left temporal forehead as well as anterior chin. Neck: normal, supple Respiratory: Poor aeration with scattered rhonchi throughout but no wheezing or crackles.  Normal respiratory effort on room air. No accessory muscle use.  Cardiovascular: Irregularly irregular rate, no murmurs / rubs / gallops. No extremity edema. 2+ pedal pulses. Abdomen: no tenderness, no masses palpated. Bowel sounds positive.  Musculoskeletal: no clubbing / cyanosis. No joint deformity upper and lower extremities. Good ROM, no contractures. Normal muscle tone.  Skin: no rashes, lesions, ulcers. No induration Neurologic: CN 2-12 grossly intact. Sensation intact,  Strength 5/5 in all 4.  Psychiatric: Normal  judgment and insight. Alert and oriented x 3 although fatigue/drowsy. Normal mood.     Labs on Admission: I have personally reviewed following labs and imaging studies  CBC: Recent Labs  Lab 03/11/20 2035  WBC 8.3  NEUTROABS 6.5  HGB 15.0  HCT 43.5  MCV 93.3  PLT 308   Basic Metabolic Panel: Recent Labs  Lab 03/11/20 2035  NA 119*  K 5.8*  CL 87*  CO2 22  GLUCOSE 220*  BUN 21  CREATININE 0.61  CALCIUM 9.5   GFR: Estimated Creatinine Clearance: 45.8 mL/min (by C-G formula based on SCr of 0.61 mg/dL). Liver Function Tests: Recent Labs  Lab 03/11/20 2035  AST 20  ALT 21  ALKPHOS 86  BILITOT 1.0  PROT 7.5  ALBUMIN 4.4   No results for input(s): LIPASE, AMYLASE in the last 168 hours. No results for input(s): AMMONIA in the last 168 hours. Coagulation Profile: No results for input(s): INR, PROTIME in the last 168 hours. Cardiac Enzymes: No results for input(s): CKTOTAL, CKMB, CKMBINDEX, TROPONINI in the last 168 hours. BNP (last 3 results) No results for input(s): PROBNP in the last 8760 hours. HbA1C: No results for input(s): HGBA1C in the last 72 hours. CBG: Recent Labs  Lab 03/11/20 2037  GLUCAP 219*   Lipid Profile: No results for input(s): CHOL, HDL, LDLCALC, TRIG, CHOLHDL, LDLDIRECT in the last 72 hours. Thyroid Function Tests: No results for input(s): TSH, T4TOTAL, FREET4, T3FREE, THYROIDAB in the last 72 hours. Anemia Panel: No results for input(s): VITAMINB12, FOLATE, FERRITIN, TIBC, IRON, RETICCTPCT in the last 72 hours. Urine analysis: No results found for: COLORURINE, APPEARANCEUR, LABSPEC, PHURINE, GLUCOSEU, HGBUR, BILIRUBINUR, KETONESUR, PROTEINUR, UROBILINOGEN, NITRITE, LEUKOCYTESUR  Radiological Exams on Admission: DG Chest 2 View  Result Date: 03/11/2020 CLINICAL DATA:  Fall EXAM: CHEST - 2 VIEW COMPARISON:  None. FINDINGS: Small bilateral pleural effusions. Mild cardiomegaly with central congestion. Mild diffuse bilateral interstitial  and ground-glass opacity. Aortic atherosclerosis. No pneumothorax. IMPRESSION: Mild cardiomegaly with central vascular congestion and small pleural effusions. Mild diffuse interstitial and ground-glass opacity, probably reflecting mild pulmonary edema, less likely atypical infection. Electronically Signed   By:  Jasmine PangKim  Fujinaga M.D.   On: 03/11/2020 19:49   DG Pelvis 1-2 Views  Result Date: 03/11/2020 CLINICAL DATA:  Fall EXAM: PELVIS - 1-2 VIEW COMPARISON:  None. FINDINGS: There is no evidence of pelvic fracture or diastasis. No pelvic bone lesions are seen. Vascular calcifications. IMPRESSION: Negative. Electronically Signed   By: Jasmine PangKim  Fujinaga M.D.   On: 03/11/2020 19:49   CT Head Wo Contrast  Result Date: 03/11/2020 CLINICAL DATA:  74 year old female with facial trauma. EXAM: CT HEAD WITHOUT CONTRAST CT MAXILLOFACIAL WITHOUT CONTRAST CT CERVICAL SPINE WITHOUT CONTRAST TECHNIQUE: Multidetector CT imaging of the head, cervical spine, and maxillofacial structures were performed using the standard protocol without intravenous contrast. Multiplanar CT image reconstructions of the cervical spine and maxillofacial structures were also generated. COMPARISON:  None. FINDINGS: Evaluation of this exam is limited due to motion artifact. CT HEAD FINDINGS Brain: There is moderate age-related atrophy and chronic microvascular ischemic changes. There is a small right frontal convexity subarachnoid hemorrhage. No mass effect or midline shift. Vascular: No hyperdense vessel or unexpected calcification. Skull: Normal. Negative for fracture or focal lesion. Other: None CT MAXILLOFACIAL FINDINGS Osseous: No acute fracture. No mandibular subluxation. Orbits: The globes and retro-orbital fat are preserved. Sinuses: Chronic appearing complete opacification of the left maxillary sinus and ethmoid air cells with remodeling of the surrounding bones. No air-fluid level. The mastoid air cells are clear. Soft tissues: Negative. CT  CERVICAL SPINE FINDINGS Alignment: No acute subluxation. There is reversal of normal cervical lordosis which may be positional or due to chronic spasm. Skull base and vertebrae: No acute fracture. Evaluation however is limited due to respiratory motion artifact. Soft tissues and spinal canal: No prevertebral fluid or swelling. No visible canal hematoma. Disc levels: Multilevel degenerative changes with multilevel facet arthropathy. Upper chest: Partially visualized right pleural effusion. Other: Bilateral carotid bulb calcified plaques. IMPRESSION: 1. Small right frontal convexity subarachnoid hemorrhage. 2. No acute/traumatic cervical spine pathology. 3. No acute facial bone fractures. 4. Chronic left paranasal sinus disease. These results were called by telephone at the time of interpretation on 03/11/2020 at 9:09 pm to [facility] , who verbally acknowledged these results. Electronically Signed   By: Elgie CollardArash  Radparvar M.D.   On: 03/11/2020 21:08   CT Cervical Spine Wo Contrast  Result Date: 03/11/2020 CLINICAL DATA:  74 year old female with facial trauma. EXAM: CT HEAD WITHOUT CONTRAST CT MAXILLOFACIAL WITHOUT CONTRAST CT CERVICAL SPINE WITHOUT CONTRAST TECHNIQUE: Multidetector CT imaging of the head, cervical spine, and maxillofacial structures were performed using the standard protocol without intravenous contrast. Multiplanar CT image reconstructions of the cervical spine and maxillofacial structures were also generated. COMPARISON:  None. FINDINGS: Evaluation of this exam is limited due to motion artifact. CT HEAD FINDINGS Brain: There is moderate age-related atrophy and chronic microvascular ischemic changes. There is a small right frontal convexity subarachnoid hemorrhage. No mass effect or midline shift. Vascular: No hyperdense vessel or unexpected calcification. Skull: Normal. Negative for fracture or focal lesion. Other: None CT MAXILLOFACIAL FINDINGS Osseous: No acute fracture. No  mandibular subluxation. Orbits: The globes and retro-orbital fat are preserved. Sinuses: Chronic appearing complete opacification of the left maxillary sinus and ethmoid air cells with remodeling of the surrounding bones. No air-fluid level. The mastoid air cells are clear. Soft tissues: Negative. CT CERVICAL SPINE FINDINGS Alignment: No acute subluxation. There is reversal of normal cervical lordosis which may be positional or due to chronic spasm. Skull base and vertebrae: No acute fracture. Evaluation however is limited due  to respiratory motion artifact. Soft tissues and spinal canal: No prevertebral fluid or swelling. No visible canal hematoma. Disc levels: Multilevel degenerative changes with multilevel facet arthropathy. Upper chest: Partially visualized right pleural effusion. Other: Bilateral carotid bulb calcified plaques. IMPRESSION: 1. Small right frontal convexity subarachnoid hemorrhage. 2. No acute/traumatic cervical spine pathology. 3. No acute facial bone fractures. 4. Chronic left paranasal sinus disease. These results were called by telephone at the time of interpretation on 03/11/2020 at 9:09 pm to provider [facility] , who verbally acknowledged these results. Electronically Signed   By: Elgie Collard M.D.   On: 03/11/2020 21:08   CT Maxillofacial Wo Contrast  Result Date: 03/11/2020 CLINICAL DATA:  74 year old female with facial trauma. EXAM: CT HEAD WITHOUT CONTRAST CT MAXILLOFACIAL WITHOUT CONTRAST CT CERVICAL SPINE WITHOUT CONTRAST TECHNIQUE: Multidetector CT imaging of the head, cervical spine, and maxillofacial structures were performed using the standard protocol without intravenous contrast. Multiplanar CT image reconstructions of the cervical spine and maxillofacial structures were also generated. COMPARISON:  None. FINDINGS: Evaluation of this exam is limited due to motion artifact. CT HEAD FINDINGS Brain: There is moderate age-related atrophy and chronic microvascular  ischemic changes. There is a small right frontal convexity subarachnoid hemorrhage. No mass effect or midline shift. Vascular: No hyperdense vessel or unexpected calcification. Skull: Normal. Negative for fracture or focal lesion. Other: None CT MAXILLOFACIAL FINDINGS Osseous: No acute fracture. No mandibular subluxation. Orbits: The globes and retro-orbital fat are preserved. Sinuses: Chronic appearing complete opacification of the left maxillary sinus and ethmoid air cells with remodeling of the surrounding bones. No air-fluid level. The mastoid air cells are clear. Soft tissues: Negative. CT CERVICAL SPINE FINDINGS Alignment: No acute subluxation. There is reversal of normal cervical lordosis which may be positional or due to chronic spasm. Skull base and vertebrae: No acute fracture. Evaluation however is limited due to respiratory motion artifact. Soft tissues and spinal canal: No prevertebral fluid or swelling. No visible canal hematoma. Disc levels: Multilevel degenerative changes with multilevel facet arthropathy. Upper chest: Partially visualized right pleural effusion. Other: Bilateral carotid bulb calcified plaques. IMPRESSION: 1. Small right frontal convexity subarachnoid hemorrhage. 2. No acute/traumatic cervical spine pathology. 3. No acute facial bone fractures. 4. Chronic left paranasal sinus disease. These results were called by telephone at the time of interpretation on 03/11/2020 at 9:09 pm to provider [facility] , who verbally acknowledged these results. Electronically Signed   By: Elgie Collard M.D.   On: 03/11/2020 21:08      Assessment/Plan  Small right frontal subarachnoid hemorrhage s/p fall Repeat CT head in the morning per neurosurgery Frequent neuro checks  Recurrent fall Unclear mechanical versus metabolic or from new atrial fibrillation Keep on telemetry PT eval  New onset atrial fibrillation with RVR Rate controlled following bolus of diltiazem but became  hypotensive obtain echo  CHA2ds2-VASc score of 5- hold anticoagulation for now due to subdural hemorrhage. Continue to watch on telemetry Check TSH  Hypotension Initially became hypotensive following diltiazem bolus for treatment of new onset atrial fibrillation.  Now resolved with IV fluids. Will need to be judicious about use of fluids given her chest x-ray now shows some cardiomegaly and vascular congestion Hold ACE and metoprolol for now  Acute on chronic hyponatremia Corrected sodium of 121 from a baseline around 125 -127 Obtain urine sodium and urine osmolality  Mild hyperkalemia Give Kayexalate  Hyperglycemia with history of type 2 diabetes Low-dose sliding scale   Tobacco use Encouraged cessation but patient does  not plan to quit.  Nicotine patch here Status is: Observation  Depression Continue mirtazapine  Fibromyalgia Continue Savella  Iron deficiency anemia Continue iron supplementation  COPD Not in acute exacerbation  The patient remains OBS appropriate and will d/c before 2 midnights.  Dispo: The patient is from: Home              Anticipated d/c is to: Home              Anticipated d/c date is: 1 day              Patient currently is not medically stable to d/c.         Anselm Jungling DO [facility]   If 7PM-7AM, please contact night-coverage [email or web address]   03/11/2020, 10:51 PM

## 2020-03-11 NOTE — Progress Notes (Signed)
Called in regards to this patients Head CT which showed a small right frontal tSAH. She reportedly fell last Friday. She does have other medical issues needing attention so hospitalists are going to admit her. She is out of her observation window since she fell 5 days ago so I am not overly concerned about this sah. Can repeat head ct in the morning. No surgical intervention is needed. Would not start blood thinners for now.

## 2020-03-11 NOTE — ED Provider Notes (Signed)
[facility]EMERGENCY DEPT Provider Note   CSN: [id number] Arrival date & time: 03/11/20  1826     History Chief Complaint  Patient presents with   Dana [name]    Dana [name] is a 74 y.o. female.  Pt presents to the ED today with several falls.  Pt fell on Friday, July 23.  She called her son and told him she could not get up.  She does not know why she fell.  She sustained a cut to her chin, but otherwise seemed ok, so the son left.  She has fallen several times since then.  Today, pt did not seem to be her normal self, so she was brought in.  Pt lives alone.  She is not on blood thinners. She has not had any fevers or chills.  She has not had the Covid vaccine.        Past Medical History:  Diagnosis Date   Asthma    Depression    Diabetes mellitus without complication (HCC)    Emphysema of lung (HCC)    Heart disease    Hyperlipidemia    Hypertension    Irritable bowel syndrome     Patient Active Problem List   Diagnosis Date Noted   Hypotension 03/12/2020   Fall at home, initial encounter 03/12/2020   Atrial fibrillation with RVR (HCC) 03/12/2020   Hyperkalemia 03/12/2020   Subarachnoid bleed (HCC) 03/11/2020   Iron deficiency anemia 12/20/2019   CKD (chronic kidney disease), stage II 04/04/2019   Microalbuminuria due to type 2 diabetes mellitus (HCC) 04/04/2019   Tobacco use disorder 12/24/2018   Hyponatremia 12/24/2018   Type 2 diabetes mellitus with diabetic neuropathy, without long-term current use of insulin (HCC) 01/07/2018   Hypertension with heart disease 01/07/2018   Hyperlipidemia associated with type 2 diabetes mellitus (HCC) 01/07/2018   Major depressive disorder in remission (HCC) 01/07/2018   Fibromyalgia 01/07/2018   COPD (chronic obstructive pulmonary disease) (HCC) 01/04/2018   Unstable gait 01/04/2018    Past Surgical History:  Procedure Laterality Date   ABDOMINAL HYSTERECTOMY      bladder tack     CHOLECYSTECTOMY       OB History   No obstetric history on file.     Family History  Problem Relation Age of Onset   Heart disease Mother    Hypertension Mother    Heart attack Father    Heart disease Father    Hyperlipidemia Father    Hypertension Father    Hypertension Sister    Cancer Brother    Depression Son    Heart attack Son    Heart disease Son    Hyperlipidemia Son    Hypertension Son    Kidney disease Son    Learning disabilities Son    Mental illness Son    Heart disease Sister    Heart attack Sister    Heart disease Sister    Heart disease Brother    Heart attack Brother    Mental illness Brother    Heart disease Brother    Mental illness Brother     Social History   Tobacco Use   Smoking status: Current Every Day Smoker    Types: Cigarettes   Smokeless tobacco: Never Used  Building services engineer Use: Never used  Substance Use Topics   Alcohol use: Not Currently   Drug use: Not Currently    Home Medications Prior to Admission medications   Medication Sig Start Date  End Date Taking? Authorizing Provider  aspirin 81 MG tablet Take 81 mg by mouth daily.   Yes [provider]  calcium citrate-vitamin D 500-400 MG-UNIT chewable tablet Chew 1 tablet by mouth daily.   Yes [provider]  cetirizine (ZYRTEC) 10 MG tablet Take 1 tablet (10 mg total) by mouth daily. 07/02/18  Yes [place], Betty G, MD  Cranberry 500 MG CAPS Take 500 mg by mouth daily.   Yes [provider]  desloratadine (CLARINEX) 5 MG tablet TAKE 1 TABLET DAILY Patient taking differently: Take 5 mg by mouth daily.  12/30/19  Yes [place], Betty G, MD  iron polysaccharides (NIFEREX) 150 MG capsule Take 150 mg by mouth daily.   Yes [provider]  JANUVIA 50 MG tablet TAKE 1 TABLET DAILY Patient taking differently: Take 50 mg by mouth daily.  11/13/19  Yes [place], Betty G, MD  LIVALO 2 MG TABS TAKE 1 TABLET  DAILY Patient taking differently: Take 2 mg by mouth daily.  12/31/19  Yes [place], Betty G, MD  losartan (COZAAR) 25 MG tablet TAKE 1 TABLET DAILY Patient taking differently: Take 25 mg by mouth daily.  12/10/19  Yes [place], Betty G, MD  metFORMIN (GLUCOPHAGE) 1000 MG tablet TAKE 1 TABLET TWICE A DAY WITH MEALS Patient taking differently: Take 1,000 mg by mouth 2 (two) times daily with a meal.  09/27/19  Yes [place], Betty G, MD  metoprolol succinate (TOPROL-XL) 50 MG 24 hr tablet TAKE 1 TABLET DAILY WITH OR IMMEDIATELY FOLLOWING A MEAL Patient taking differently: Take 50 mg by mouth daily.  12/23/19  Yes [place], Betty G, MD  mirtazapine (REMERON) 30 MG tablet TAKE 1 TABLET AT BEDTIME Patient taking differently: Take 30 mg by mouth at bedtime.  06/28/19  Yes [place], Betty G, MD  montelukast (SINGULAIR) 10 MG tablet TAKE 1 TABLET AT BEDTIME Patient taking differently: Take 10 mg by mouth at bedtime.  12/30/19  Yes [place], Betty G, MD  SAVELLA 50 MG TABS tablet TAKE 1 TABLET EVERY 12 HOURS Patient taking differently: Take 50 mg by mouth 2 (two) times daily.  06/28/19  Yes [place], Betty G, MD  vitamin C (ASCORBIC ACID) 500 MG tablet Take 500 mg by mouth daily.   Yes [provider]  vitamin E (VITAMIN E) 400 UNIT capsule Take 400 Units by mouth daily.   Yes [provider]    Allergies    Bactrim [sulfamethoxazole-trimethoprim]  Review of Systems   Review of Systems  Neurological: Positive for weakness.  All other systems reviewed and are negative.   Physical Exam Updated Vital Signs BP 124/83 (BP Location: Left Arm)    Pulse 88    Temp (!) 97.4 F (36.3 C) (Oral)    Resp 22    Ht  (1.549 m)    Wt 56.9 kg    SpO2 96%    BMI 23.70 kg/m   Physical Exam Vitals and nursing note reviewed.  Constitutional:      Appearance: Normal appearance.  HENT:     Head:     Comments: Bruising to left face and chin.  Scab to chin.    Right Ear: External ear normal.     Left Ear:  External ear normal.     Nose: Nose normal.     Mouth/Throat:     Mouth: Mucous membranes are moist.     Pharynx: Oropharynx is clear.  Eyes:     Extraocular Movements: Extraocular movements intact.  Conjunctiva/sclera: Conjunctivae normal.     Pupils: Pupils are equal, round, and reactive to light.  Cardiovascular:     Rate and Rhythm: Tachycardia present. Rhythm irregular.     Pulses: Normal pulses.     Heart sounds: Normal heart sounds.  Pulmonary:     Effort: Pulmonary effort is normal.     Breath sounds: Normal breath sounds.  Abdominal:     General: Abdomen is flat. Bowel sounds are normal.     Palpations: Abdomen is soft.  Musculoskeletal:        General: Normal range of motion.     Cervical back: Normal range of motion and neck supple.  Skin:    General: Skin is warm.     Capillary Refill: Capillary refill takes less than 2 seconds.  Neurological:     General: No focal deficit present.     Mental Status: She is alert and oriented to person, place, and time.  Psychiatric:        Mood and Affect: Mood normal.        Behavior: Behavior normal.     ED Results / Procedures / Treatments   Labs (all labs ordered are listed, but only abnormal results are displayed) Labs Reviewed  COMPREHENSIVE METABOLIC PANEL - Abnormal; Notable for the following components:      Result Value   Sodium 119 (*)    Potassium 5.8 (*)    Chloride 87 (*)    Glucose, Bld 220 (*)    All other components within normal limits  BASIC METABOLIC PANEL - Abnormal; Notable for the following components:   Sodium 122 (*)    Chloride 91 (*)    CO2 21 (*)    Glucose, Bld 198 (*)    All other components within normal limits  OSMOLALITY, URINE - Abnormal; Notable for the following components:   Osmolality, Ur 252 (*)    All other components within normal limits  HEMOGLOBIN A1C - Abnormal; Notable for the following components:   Hgb A1c MFr Bld 6.8 (*)    All other components within normal limits   GLUCOSE, CAPILLARY - Abnormal; Notable for the following components:   Glucose-Capillary 193 (*)    All other components within normal limits  GLUCOSE, CAPILLARY - Abnormal; Notable for the following components:   Glucose-Capillary 201 (*)    All other components within normal limits  COMPREHENSIVE METABOLIC PANEL - Abnormal; Notable for the following components:   Sodium 123 (*)    Chloride 92 (*)    CO2 21 (*)    Glucose, Bld 196 (*)    All other components within normal limits  GLUCOSE, CAPILLARY - Abnormal; Notable for the following components:   Glucose-Capillary 178 (*)    All other components within normal limits  GLUCOSE, CAPILLARY - Abnormal; Notable for the following components:   Glucose-Capillary 233 (*)    All other components within normal limits  GLUCOSE, CAPILLARY - Abnormal; Notable for the following components:   Glucose-Capillary 232 (*)    All other components within normal limits  CBG MONITORING, ED - Abnormal; Notable for the following components:   Glucose-Capillary 219 (*)    All other components within normal limits  SARS CORONAVIRUS 2 BY RT PCR (HOSPITAL ORDER, PERFORMED IN  HOSPITAL LAB)  CBC WITH DIFFERENTIAL/PLATELET  TSH  CBC  SODIUM, URINE, RANDOM  RAPID URINE DRUG SCREEN, HOSP PERFORMED  URINALYSIS, ROUTINE W REFLEX MICROSCOPIC  CBC WITH DIFFERENTIAL/PLATELET  COMPREHENSIVE METABOLIC PANEL  MAGNESIUM  PHOSPHORUS  TROPONIN I (HIGH SENSITIVITY)  TROPONIN I (HIGH SENSITIVITY)    EKG EKG Interpretation  Date/Time:  Wednesday March 11 2020 20:35:10 EDT Ventricular Rate:  132 PR Interval:    QRS Duration: 102 QT Interval:  311 QTC Calculation: 461 R Axis:   88 Text Interpretation: Atrial fibrillation Ventricular premature complex Anteroseptal infarct, age indeterminate No old tracing to compare Confirmed by Jacalyn Lefevre ([id number]) on 03/11/2020 8:40:46 PM   Radiology DG Chest 2 View  Result Date: 03/11/2020 CLINICAL DATA:   Fall EXAM: CHEST - 2 VIEW COMPARISON:  None. FINDINGS: Small bilateral pleural effusions. Mild cardiomegaly with central congestion. Mild diffuse bilateral interstitial and ground-glass opacity. Aortic atherosclerosis. No pneumothorax. IMPRESSION: Mild cardiomegaly with central vascular congestion and small pleural effusions. Mild diffuse interstitial and ground-glass opacity, probably reflecting mild pulmonary edema, less likely atypical infection. Electronically Signed   By: Jasmine Pang M.D.   On: 03/11/2020 19:49   DG Pelvis 1-2 Views  Result Date: 03/11/2020 CLINICAL DATA:  Fall EXAM: PELVIS - 1-2 VIEW COMPARISON:  None. FINDINGS: There is no evidence of pelvic fracture or diastasis. No pelvic bone lesions are seen. Vascular calcifications. IMPRESSION: Negative. Electronically Signed   By: Jasmine Pang M.D.   On: 03/11/2020 19:49   CT HEAD WO CONTRAST  Result Date: 03/12/2020 CLINICAL DATA:  Subarachnoid hemorrhage follow-up EXAM: CT HEAD WITHOUT CONTRAST TECHNIQUE: Contiguous axial images were obtained from the base of the skull through the vertex without intravenous contrast. COMPARISON:  03/11/2020 FINDINGS: Brain: Small volume subarachnoid hemorrhage is again identified along the right central sulcus. No new hemorrhage. Ventricles are stable in size. Stable findings of chronic microvascular ischemic changes. Gray-white differentiation remains preserved. Vascular: There is atherosclerotic calcification at the skull base. Skull: Calvarium is unremarkable. Sinuses/Orbits: Opacified partially imaged left frontal, maxillary, and anterior ethmoid sinuses. Obstructing ostiomeatal unit lesion such as a polyp is not excluded. No acute orbital abnormality. Other: Mastoid air cells are clear. IMPRESSION: Stable small volume sulcal subarachnoid hemorrhage. No new findings. Electronically Signed   By: Guadlupe Spanish M.D.   On: 03/12/2020 10:04   CT Head Wo Contrast  Result Date: 03/11/2020 CLINICAL DATA:   74 year old female with facial trauma. EXAM: CT HEAD WITHOUT CONTRAST CT MAXILLOFACIAL WITHOUT CONTRAST CT CERVICAL SPINE WITHOUT CONTRAST TECHNIQUE: Multidetector CT imaging of the head, cervical spine, and maxillofacial structures were performed using the standard protocol without intravenous contrast. Multiplanar CT image reconstructions of the cervical spine and maxillofacial structures were also generated. COMPARISON:  None. FINDINGS: Evaluation of this exam is limited due to motion artifact. CT HEAD FINDINGS Brain: There is moderate age-related atrophy and chronic microvascular ischemic changes. There is a small right frontal convexity subarachnoid hemorrhage. No mass effect or midline shift. Vascular: No hyperdense vessel or unexpected calcification. Skull: Normal. Negative for fracture or focal lesion. Other: None CT MAXILLOFACIAL FINDINGS Osseous: No acute fracture. No mandibular subluxation. Orbits: The globes and retro-orbital fat are preserved. Sinuses: Chronic appearing complete opacification of the left maxillary sinus and ethmoid air cells with remodeling of the surrounding bones. No air-fluid level. The mastoid air cells are clear. Soft tissues: Negative. CT CERVICAL SPINE FINDINGS Alignment: No acute subluxation. There is reversal of normal cervical lordosis which may be positional or due to chronic spasm. Skull base and vertebrae: No acute fracture. Evaluation however is limited due to respiratory motion artifact. Soft tissues and spinal canal: No prevertebral fluid or swelling. No visible canal hematoma. Disc levels: Multilevel degenerative changes with multilevel facet  arthropathy. Upper chest: Partially visualized right pleural effusion. Other: Bilateral carotid bulb calcified plaques. IMPRESSION: 1. Small right frontal convexity subarachnoid hemorrhage. 2. No acute/traumatic cervical spine pathology. 3. No acute facial bone fractures. 4. Chronic left paranasal sinus disease. These results were  called by telephone at the time of interpretation on 03/11/2020 at 9:09 pm to provider [facility] Mar[facility]abel Macpherson , who verbally acknowledged these results. Electronically Signed   By: Elgie CollardArash  Radparvar M.D.   On: 03/11/2020 21:08   CT Cervical Spine Wo Contrast  Result Date: 03/11/2020 CLINICAL DATA:  74 year old female with facial trauma. EXAM: CT HEAD WITHOUT CONTRAST CT MAXILLOFACIAL WITHOUT CONTRAST CT CERVICAL SPINE WITHOUT CONTRAST TECHNIQUE: Multidetector CT imaging of the head, cervical spine, and maxillofacial structures were performed using the standard protocol without intravenous contrast. Multiplanar CT image reconstructions of the cervical spine and maxillofacial structures were also generated. COMPARISON:  None. FINDINGS: Evaluation of this exam is limited due to motion artifact. CT HEAD FINDINGS Brain: There is moderate age-related atrophy and chronic microvascular ischemic changes. There is a small right frontal convexity subarachnoid hemorrhage. No mass effect or midline shift. Vascular: No hyperdense vessel or unexpected calcification. Skull: Normal. Negative for fracture or focal lesion. Other: None CT MAXILLOFACIAL FINDINGS Osseous: No acute fracture. No mandibular subluxation. Orbits: The globes and retro-orbital fat are preserved. Sinuses: Chronic appearing complete opacification of the left maxillary sinus and ethmoid air cells with remodeling of the surrounding bones. No air-fluid level. The mastoid air cells are clear. Soft tissues: Negative. CT CERVICAL SPINE FINDINGS Alignment: No acute subluxation. There is reversal of normal cervical lordosis which may be positional or due to chronic spasm. Skull base and vertebrae: No acute fracture. Evaluation however is limited due to respiratory motion artifact. Soft tissues and spinal canal: No prevertebral fluid or swelling. No visible canal hematoma. Disc levels: Multilevel degenerative changes with multilevel facet arthropathy. Upper chest: Partially  visualized right pleural effusion. Other: Bilateral carotid bulb calcified plaques. IMPRESSION: 1. Small right frontal convexity subarachnoid hemorrhage. 2. No acute/traumatic cervical spine pathology. 3. No acute facial bone fractures. 4. Chronic left paranasal sinus disease. These results were called by telephone at the time of interpretation on 03/11/2020 at 9:09 pm to [facility] , who verbally acknowledged these results. Electronically Signed   By: Elgie CollardArash  Radparvar M.D.   On: 03/11/2020 21:08   ECHOCARDIOGRAM COMPLETE  Result Date: 03/12/2020    ECHOCARDIOGRAM REPORT   Patient Name:   Arletha GrippeMARTHA HOWARD Mcconaha Date of Exam: 03/12/2020 Medical Rec #:  [id number]            Height:       61.0 in Accession #:    [id number]           Weight:       125.4 lb Date of Birth:  11/28/1945             BSA:          1.549 m Patient Age:    74 years             BP:           94/68 mmHg Patient Gender: F                    HR:           115 bpm. Exam Location:  Inpatient Procedure: 2D Echo Indications:     Atrial Fibrillation 427.31 / I48.91  History:  Patient has no prior history of Echocardiogram examinations.                  COPD; Risk Factors:Diabetes, Current Smoker, Hypertension and                  Dyslipidemia. Chronic kidney disease, anemia. Emphysema.  Sonographer:     Leta Jungling RDCS Referring Phys:  [id number] [facility] Diagnosing Phys: Epifanio Lesches MD IMPRESSIONS  1. Left ventricular ejection fraction, by estimation, is 50 to 55%. The left ventricle has low normal function. The left ventricle has no regional wall motion abnormalities. There is moderate asymmetric left ventricular hypertrophy of the basal-septal segment. Left ventricular diastolic parameters are indeterminate.  2. Right ventricular systolic function is mildly reduced. The right ventricular size is normal. There is mildly elevated pulmonary artery systolic pressure. The estimated right ventricular systolic pressure is 35.3  mmHg.  3. The mitral valve is degenerative. Severe mitral annular calcification. Trivial mitral valve regurgitation. Severe mitral stenosis. MG 12 mmHg at HR 105 bpm, MVA 0.7cm^2 by continuity equation. Gradients increased due to Afib with RVR  4. The aortic valve is tricuspid. Aortic valve regurgitation is not visualized. Mild to moderate aortic valve sclerosis/calcification is present, without any evidence of aortic stenosis.  5. The inferior vena cava is normal in size with greater than 50% respiratory variability, suggesting right atrial pressure of 3 mmHg. FINDINGS  Left Ventricle: Left ventricular ejection fraction, by estimation, is 50 to 55%. The left ventricle has low normal function. The left ventricle has no regional wall motion abnormalities. The left ventricular internal cavity size was small. There is moderate asymmetric left ventricular hypertrophy of the basal-septal segment. Left ventricular diastolic parameters are indeterminate. Right Ventricle: The right ventricular size is normal. Right vetricular wall thickness was not assessed. Right ventricular systolic function is mildly reduced. There is mildly elevated pulmonary artery systolic pressure. The tricuspid regurgitant velocity is 2.84 m/s, and with an assumed right atrial pressure of 3 mmHg, the estimated right ventricular systolic pressure is 35.3 mmHg. Left Atrium: Left atrial size was normal in size. Right Atrium: Right atrial size was normal in size. Pericardium: There is no evidence of pericardial effusion. Mitral Valve: The mitral valve is degenerative in appearance. Severe mitral annular calcification. Trivial mitral valve regurgitation. Severe mitral valve stenosis. MV peak gradient, 24.3 mmHg. The mean mitral valve gradient is 11.0 mmHg. Tricuspid Valve: The tricuspid valve is normal in structure. Tricuspid valve regurgitation is trivial. Aortic Valve: The aortic valve is tricuspid. Aortic valve regurgitation is not visualized. Mild to  moderate aortic valve sclerosis/calcification is present, without any evidence of aortic stenosis. Pulmonic Valve: The pulmonic valve was not well visualized. Pulmonic valve regurgitation is not visualized. Aorta: The aortic root and ascending aorta are structurally normal, with no evidence of dilitation. Venous: The inferior vena cava is normal in size with greater than 50% respiratory variability, suggesting right atrial pressure of 3 mmHg. IAS/Shunts: No atrial level shunt detected by color flow Doppler.  LEFT VENTRICLE PLAX 2D LVIDd:         3.30 cm LVIDs:         2.80 cm LV PW:         1.30 cm LV IVS:        1.40 cm LVOT diam:     1.60 cm LV SV:         27 LV SV Index:   17 LVOT Area:     2.01 cm  LEFT ATRIUM             Index       RIGHT ATRIUM           Index LA diam:        4.20 cm 2.71 cm/m  RA Area:     10.50 cm LA Vol (A2C):   27.9 ml 18.01 ml/m RA Volume:   19.00 ml  12.27 ml/m LA Vol (A4C):   43.4 ml 28.02 ml/m LA Biplane Vol: 35.6 ml 22.98 ml/m  AORTIC VALVE LVOT Vmax:   75.90 cm/s LVOT Vmean:  51.500 cm/s LVOT VTI:    0.134 m  AORTA Ao Root diam: 2.60 cm MITRAL VALVE                TRICUSPID VALVE MV Area (PHT): 1.93 cm     TR Peak grad:   32.3 mmHg MV Peak grad:  24.3 mmHg    TR Vmax:        284.00 cm/s MV Mean grad:  11.0 mmHg MV Vmax:       2.46 m/s     SHUNTS MV Vmean:      159.0 cm/s   Systemic VTI:  0.13 m MV Decel Time: 393 msec     Systemic Diam: 1.60 cm MV E velocity: 179.33 cm/s Epifanio Lesches MD Electronically signed by Epifanio Lesches MD Signature Date/Time: 03/12/2020/4:50:57 PM    Final (Updated)    CT Maxillofacial Wo Contrast  Result Date: 03/11/2020 CLINICAL DATA:  74 year old female with facial trauma. EXAM: CT HEAD WITHOUT CONTRAST CT MAXILLOFACIAL WITHOUT CONTRAST CT CERVICAL SPINE WITHOUT CONTRAST TECHNIQUE: Multidetector CT imaging of the head, cervical spine, and maxillofacial structures were performed using the standard protocol without intravenous  contrast. Multiplanar CT image reconstructions of the cervical spine and maxillofacial structures were also generated. COMPARISON:  None. FINDINGS: Evaluation of this exam is limited due to motion artifact. CT HEAD FINDINGS Brain: There is moderate age-related atrophy and chronic microvascular ischemic changes. There is a small right frontal convexity subarachnoid hemorrhage. No mass effect or midline shift. Vascular: No hyperdense vessel or unexpected calcification. Skull: Normal. Negative for fracture or focal lesion. Other: None CT MAXILLOFACIAL FINDINGS Osseous: No acute fracture. No mandibular subluxation. Orbits: The globes and retro-orbital fat are preserved. Sinuses: Chronic appearing complete opacification of the left maxillary sinus and ethmoid air cells with remodeling of the surrounding bones. No air-fluid level. The mastoid air cells are clear. Soft tissues: Negative. CT CERVICAL SPINE FINDINGS Alignment: No acute subluxation. There is reversal of normal cervical lordosis which may be positional or due to chronic spasm. Skull base and vertebrae: No acute fracture. Evaluation however is limited due to respiratory motion artifact. Soft tissues and spinal canal: No prevertebral fluid or swelling. No visible canal hematoma. Disc levels: Multilevel degenerative changes with multilevel facet arthropathy. Upper chest: Partially visualized right pleural effusion. Other: Bilateral carotid bulb calcified plaques. IMPRESSION: 1. Small right frontal convexity subarachnoid hemorrhage. 2. No acute/traumatic cervical spine pathology. 3. No acute facial bone fractures. 4. Chronic left paranasal sinus disease. These results were called by telephone at the time of interpretation on 03/11/2020 at 9:09 pm to provider [facility] , who verbally acknowledged these results. Electronically Signed   By: Elgie Collard M.D.   On: 03/11/2020 21:08    Procedures Procedures (including critical care time)  Medications  Ordered in ED Medications  insulin aspart (novoLOG) injection 0-9 Units (3 Units Subcutaneous Given 03/12/20 1705)  insulin aspart (novoLOG)  injection 0-5 Units (2 Units Subcutaneous Given 03/12/20 2207)  pravastatin (PRAVACHOL) tablet 40 mg (40 mg Oral Given 03/12/20 1758)  mirtazapine (REMERON) tablet 30 mg (30 mg Oral Given 03/12/20 2208)  Milnacipran (SAVELLA) tablet TABS 50 mg (50 mg Oral Given 03/12/20 2208)  iron polysaccharides (NIFEREX) capsule 150 mg (150 mg Oral Given 03/12/20 1138)  loratadine (CLARITIN) tablet 10 mg (10 mg Oral Given 03/12/20 1138)  montelukast (SINGULAIR) tablet 10 mg (10 mg Oral Given 03/12/20 2207)  nicotine (NICODERM CQ - dosed in mg/24 hours) patch 21 mg (21 mg Transdermal Patch Applied 03/12/20 1323)  diltiazem (CARDIZEM CD) 24 hr capsule 240 mg (240 mg Oral Given 03/12/20 1352)  digoxin (LANOXIN) tablet 0.125 mg (has no administration in time range)  levalbuterol (XOPENEX) nebulizer solution 0.63 mg (0.63 mg Nebulization Given 03/12/20 2150)  ipratropium (ATROVENT) nebulizer solution 0.5 mg (0.5 mg Nebulization Given 03/12/20 2150)  levalbuterol (XOPENEX) nebulizer solution 0.63 mg (has no administration in time range)  sodium chloride 0.9 % bolus 1,000 mL (0 mLs Intravenous Stopped 03/11/20 2200)  diltiazem (CARDIZEM) 1 mg/mL load via infusion 20 mg (20 mg Intravenous Bolus from Bag 03/11/20 2054)  sodium polystyrene (KAYEXALATE) 15 GM/60ML suspension 15 g (15 g Oral Given 03/12/20 0207)  digoxin (LANOXIN) tablet 0.25 mg (0.25 mg Oral Given 03/12/20 1758)    ED Course  I have reviewed the triage vital signs and the nursing notes.  Pertinent labs & imaging results that were available during my care of the patient were reviewed by me and considered in my medical decision making (see chart for details).    MDM Rules/Calculators/A&P                          Pt is in new onset afib.  She is given cardizem 20 mg bolus and put on a drip.  BP did drop to the 80s, so the  drip was stopped.  HR staying in the 70s.  Pt given 1L NS to help with bp.  CXR shows some CHF, so she is not given additional IVFs.  Pt does have a small SAH, so she can't be put on any blood thinners which is unfortunate as she has new onset afib.  SAH d/w NS.  Nothing acute to do.  They recommend repeating CT head in the morning.    Pt is hyponatremic with a Na at 119.  Pt has had hyponatremia in the past, but not this low.  Pt is not on any diuretics.  Pt d/w Dr. Cyndia Bent for admission.  CRITICAL CARE Performed by: Jacalyn Lefevre   Total critical care time: 30 minutes  Critical care time was exclusive of separately billable procedures and treating other patients.  Critical care was necessary to treat or prevent imminent or life-threatening deterioration.  Critical care was time spent personally by me on the following activities: development of treatment plan with patient and/or surrogate as well as nursing, discussions with consultants, evaluation of patient's response to treatment, examination of patient, obtaining history from patient or surrogate, ordering and performing treatments and interventions, ordering and review of laboratory studies, ordering and review of radiographic studies, pulse oximetry and re-evaluation of patient's condition.   Final Clinical Impression(s) / ED Diagnoses Final diagnoses:  Fall, initial encounter  SAH (subarachnoid hemorrhage) (HCC)  Atrial fibrillation with RVR (HCC)  Hyponatremia  Hyperkalemia  Tobacco abuse    Rx / DC Orders ED Discharge Orders    None  Jacalyn Lefevre, MD 03/12/20 [phone]

## 2020-03-11 NOTE — ED Triage Notes (Signed)
Pt arrived via walk in, from home. Per pt son, pt fell at home on Friday and was unable to get up on her own, called son for assistance. Pt states she does not remember the fall and is not sure if it was mechanical or otherwise. Pt has fallen on couch multiple times since, including today.   Lives at home alone, no assistive devices.   Pt son also states after fall earlier today, speech was noted to be "slower than normal" but since resolved. No neuro deficits in triage.

## 2020-03-11 NOTE — ED Notes (Signed)
Patient transported to CT 

## 2020-03-11 NOTE — ED Notes (Signed)
Patient is being discharged from the Urgent Care and sent to the Emergency Department via POV. Per [facility], patient is in need of higher level of care due to frequent falls and slurred speech. Patient is aware and verbalizes understanding of plan of care.  Vitals:   03/11/20 1806 03/11/20 1808  BP: 107/84   Pulse:  75  Resp: 18   SpO2:  95%

## 2020-03-12 ENCOUNTER — Observation Stay (HOSPITAL_COMMUNITY): Payer: Medicare Other

## 2020-03-12 DIAGNOSIS — K589 Irritable bowel syndrome without diarrhea: Secondary | ICD-10-CM | POA: Diagnosis not present

## 2020-03-12 DIAGNOSIS — E875 Hyperkalemia: Secondary | ICD-10-CM | POA: Diagnosis not present

## 2020-03-12 DIAGNOSIS — Z20822 Contact with and (suspected) exposure to covid-19: Secondary | ICD-10-CM | POA: Diagnosis present

## 2020-03-12 DIAGNOSIS — S0181XA Laceration without foreign body of other part of head, initial encounter: Secondary | ICD-10-CM | POA: Diagnosis present

## 2020-03-12 DIAGNOSIS — I110 Hypertensive heart disease with heart failure: Secondary | ICD-10-CM | POA: Diagnosis not present

## 2020-03-12 DIAGNOSIS — I609 Nontraumatic subarachnoid hemorrhage, unspecified: Secondary | ICD-10-CM | POA: Diagnosis not present

## 2020-03-12 DIAGNOSIS — J189 Pneumonia, unspecified organism: Secondary | ICD-10-CM | POA: Diagnosis not present

## 2020-03-12 DIAGNOSIS — Z7401 Bed confinement status: Secondary | ICD-10-CM | POA: Diagnosis not present

## 2020-03-12 DIAGNOSIS — E872 Acidosis: Secondary | ICD-10-CM | POA: Diagnosis present

## 2020-03-12 DIAGNOSIS — I480 Paroxysmal atrial fibrillation: Secondary | ICD-10-CM | POA: Diagnosis present

## 2020-03-12 DIAGNOSIS — R402142 Coma scale, eyes open, spontaneous, at arrival to emergency department: Secondary | ICD-10-CM | POA: Diagnosis present

## 2020-03-12 DIAGNOSIS — I350 Nonrheumatic aortic (valve) stenosis: Secondary | ICD-10-CM

## 2020-03-12 DIAGNOSIS — M2550 Pain in unspecified joint: Secondary | ICD-10-CM | POA: Diagnosis not present

## 2020-03-12 DIAGNOSIS — W19XXXA Unspecified fall, initial encounter: Secondary | ICD-10-CM | POA: Diagnosis not present

## 2020-03-12 DIAGNOSIS — R402362 Coma scale, best motor response, obeys commands, at arrival to emergency department: Secondary | ICD-10-CM | POA: Diagnosis present

## 2020-03-12 DIAGNOSIS — E1122 Type 2 diabetes mellitus with diabetic chronic kidney disease: Secondary | ICD-10-CM | POA: Diagnosis present

## 2020-03-12 DIAGNOSIS — J9602 Acute respiratory failure with hypercapnia: Secondary | ICD-10-CM | POA: Diagnosis not present

## 2020-03-12 DIAGNOSIS — I959 Hypotension, unspecified: Secondary | ICD-10-CM

## 2020-03-12 DIAGNOSIS — J321 Chronic frontal sinusitis: Secondary | ICD-10-CM | POA: Diagnosis not present

## 2020-03-12 DIAGNOSIS — Z66 Do not resuscitate: Secondary | ICD-10-CM | POA: Diagnosis present

## 2020-03-12 DIAGNOSIS — Y92009 Unspecified place in unspecified non-institutional (private) residence as the place of occurrence of the external cause: Secondary | ICD-10-CM | POA: Diagnosis not present

## 2020-03-12 DIAGNOSIS — I7090 Unspecified atherosclerosis: Secondary | ICD-10-CM | POA: Diagnosis not present

## 2020-03-12 DIAGNOSIS — I130 Hypertensive heart and chronic kidney disease with heart failure and stage 1 through stage 4 chronic kidney disease, or unspecified chronic kidney disease: Secondary | ICD-10-CM | POA: Diagnosis present

## 2020-03-12 DIAGNOSIS — I4891 Unspecified atrial fibrillation: Secondary | ICD-10-CM

## 2020-03-12 DIAGNOSIS — J320 Chronic maxillary sinusitis: Secondary | ICD-10-CM | POA: Diagnosis not present

## 2020-03-12 DIAGNOSIS — E871 Hypo-osmolality and hyponatremia: Secondary | ICD-10-CM

## 2020-03-12 DIAGNOSIS — I342 Nonrheumatic mitral (valve) stenosis: Secondary | ICD-10-CM

## 2020-03-12 DIAGNOSIS — J690 Pneumonitis due to inhalation of food and vomit: Secondary | ICD-10-CM | POA: Diagnosis not present

## 2020-03-12 DIAGNOSIS — G9341 Metabolic encephalopathy: Secondary | ICD-10-CM | POA: Diagnosis present

## 2020-03-12 DIAGNOSIS — E1140 Type 2 diabetes mellitus with diabetic neuropathy, unspecified: Secondary | ICD-10-CM

## 2020-03-12 DIAGNOSIS — R0902 Hypoxemia: Secondary | ICD-10-CM | POA: Diagnosis not present

## 2020-03-12 DIAGNOSIS — Z515 Encounter for palliative care: Secondary | ICD-10-CM | POA: Diagnosis not present

## 2020-03-12 DIAGNOSIS — J441 Chronic obstructive pulmonary disease with (acute) exacerbation: Secondary | ICD-10-CM | POA: Diagnosis not present

## 2020-03-12 DIAGNOSIS — R402252 Coma scale, best verbal response, oriented, at arrival to emergency department: Secondary | ICD-10-CM | POA: Diagnosis present

## 2020-03-12 DIAGNOSIS — E785 Hyperlipidemia, unspecified: Secondary | ICD-10-CM | POA: Diagnosis not present

## 2020-03-12 DIAGNOSIS — F17200 Nicotine dependence, unspecified, uncomplicated: Secondary | ICD-10-CM | POA: Diagnosis not present

## 2020-03-12 DIAGNOSIS — J90 Pleural effusion, not elsewhere classified: Secondary | ICD-10-CM | POA: Diagnosis not present

## 2020-03-12 DIAGNOSIS — N182 Chronic kidney disease, stage 2 (mild): Secondary | ICD-10-CM | POA: Diagnosis present

## 2020-03-12 DIAGNOSIS — I5021 Acute systolic (congestive) heart failure: Secondary | ICD-10-CM | POA: Diagnosis not present

## 2020-03-12 DIAGNOSIS — E119 Type 2 diabetes mellitus without complications: Secondary | ICD-10-CM | POA: Diagnosis not present

## 2020-03-12 DIAGNOSIS — R0602 Shortness of breath: Secondary | ICD-10-CM | POA: Diagnosis not present

## 2020-03-12 DIAGNOSIS — J811 Chronic pulmonary edema: Secondary | ICD-10-CM | POA: Diagnosis not present

## 2020-03-12 DIAGNOSIS — R918 Other nonspecific abnormal finding of lung field: Secondary | ICD-10-CM | POA: Diagnosis not present

## 2020-03-12 DIAGNOSIS — N179 Acute kidney failure, unspecified: Secondary | ICD-10-CM | POA: Diagnosis not present

## 2020-03-12 DIAGNOSIS — Z7189 Other specified counseling: Secondary | ICD-10-CM | POA: Diagnosis not present

## 2020-03-12 DIAGNOSIS — J9801 Acute bronchospasm: Secondary | ICD-10-CM | POA: Diagnosis not present

## 2020-03-12 DIAGNOSIS — S066X9A Traumatic subarachnoid hemorrhage with loss of consciousness of unspecified duration, initial encounter: Secondary | ICD-10-CM | POA: Diagnosis not present

## 2020-03-12 DIAGNOSIS — F325 Major depressive disorder, single episode, in full remission: Secondary | ICD-10-CM | POA: Diagnosis not present

## 2020-03-12 DIAGNOSIS — I509 Heart failure, unspecified: Secondary | ICD-10-CM | POA: Diagnosis not present

## 2020-03-12 DIAGNOSIS — I6782 Cerebral ischemia: Secondary | ICD-10-CM | POA: Diagnosis not present

## 2020-03-12 DIAGNOSIS — M797 Fibromyalgia: Secondary | ICD-10-CM | POA: Diagnosis not present

## 2020-03-12 DIAGNOSIS — J449 Chronic obstructive pulmonary disease, unspecified: Secondary | ICD-10-CM | POA: Diagnosis not present

## 2020-03-12 DIAGNOSIS — Z4682 Encounter for fitting and adjustment of non-vascular catheter: Secondary | ICD-10-CM | POA: Diagnosis not present

## 2020-03-12 DIAGNOSIS — J439 Emphysema, unspecified: Secondary | ICD-10-CM

## 2020-03-12 DIAGNOSIS — D509 Iron deficiency anemia, unspecified: Secondary | ICD-10-CM | POA: Diagnosis not present

## 2020-03-12 DIAGNOSIS — R0603 Acute respiratory distress: Secondary | ICD-10-CM | POA: Diagnosis not present

## 2020-03-12 DIAGNOSIS — R627 Adult failure to thrive: Secondary | ICD-10-CM | POA: Diagnosis present

## 2020-03-12 DIAGNOSIS — I5031 Acute diastolic (congestive) heart failure: Secondary | ICD-10-CM | POA: Diagnosis not present

## 2020-03-12 DIAGNOSIS — Y92230 Patient room in hospital as the place of occurrence of the external cause: Secondary | ICD-10-CM | POA: Diagnosis not present

## 2020-03-12 DIAGNOSIS — F329 Major depressive disorder, single episode, unspecified: Secondary | ICD-10-CM | POA: Diagnosis not present

## 2020-03-12 DIAGNOSIS — J810 Acute pulmonary edema: Secondary | ICD-10-CM | POA: Diagnosis not present

## 2020-03-12 DIAGNOSIS — J9601 Acute respiratory failure with hypoxia: Secondary | ICD-10-CM | POA: Diagnosis not present

## 2020-03-12 DIAGNOSIS — I952 Hypotension due to drugs: Secondary | ICD-10-CM | POA: Diagnosis not present

## 2020-03-12 DIAGNOSIS — S066X0A Traumatic subarachnoid hemorrhage without loss of consciousness, initial encounter: Secondary | ICD-10-CM | POA: Diagnosis present

## 2020-03-12 DIAGNOSIS — Z6823 Body mass index (BMI) 23.0-23.9, adult: Secondary | ICD-10-CM | POA: Diagnosis not present

## 2020-03-12 DIAGNOSIS — I050 Rheumatic mitral stenosis: Secondary | ICD-10-CM | POA: Diagnosis not present

## 2020-03-12 DIAGNOSIS — J9690 Respiratory failure, unspecified, unspecified whether with hypoxia or hypercapnia: Secondary | ICD-10-CM | POA: Diagnosis not present

## 2020-03-12 DIAGNOSIS — R64 Cachexia: Secondary | ICD-10-CM | POA: Diagnosis present

## 2020-03-12 DIAGNOSIS — S066X9S Traumatic subarachnoid hemorrhage with loss of consciousness of unspecified duration, sequela: Secondary | ICD-10-CM | POA: Diagnosis not present

## 2020-03-12 LAB — BASIC METABOLIC PANEL
Anion gap: 10 (ref 5–15)
BUN: 18 mg/dL (ref 8–23)
CO2: 21 mmol/L — ABNORMAL LOW (ref 22–32)
Calcium: 9.2 mg/dL (ref 8.9–10.3)
Chloride: 91 mmol/L — ABNORMAL LOW (ref 98–111)
Creatinine, Ser: 0.55 mg/dL (ref 0.44–1.00)
GFR calc Af Amer: >60 mL/min (ref >60)
GFR calc non Af Amer: >60 mL/min (ref >60)
Glucose, Bld: 198 mg/dL — ABNORMAL HIGH (ref 70–99)
Potassium: 5 mmol/L (ref 3.5–5.1)
Sodium: 122 mmol/L — ABNORMAL LOW (ref 135–145)

## 2020-03-12 LAB — CBC
HCT: 40.7 % (ref 36.0–46.0)
Hemoglobin: 13.6 g/dL (ref 12.0–15.0)
MCH: 31.5 pg (ref 26.0–34.0)
MCHC: 33.4 g/dL (ref 30.0–36.0)
MCV: 94.2 fL (ref 80.0–100.0)
Platelets: 259 10*3/uL (ref 150–400)
RBC: 4.32 MIL/uL (ref 3.87–5.11)
RDW: 13.3 % (ref 11.5–15.5)
WBC: 6.9 10*3/uL (ref 4.0–10.5)
nRBC: 0 % (ref 0.0–0.2)

## 2020-03-12 LAB — HEMOGLOBIN A1C
Hgb A1c MFr Bld: 6.8 % — ABNORMAL HIGH (ref 4.8–5.6)
Mean Plasma Glucose: 148.46 mg/dL

## 2020-03-12 LAB — COMPREHENSIVE METABOLIC PANEL
ALT: 20 U/L (ref 0–44)
AST: 18 U/L (ref 15–41)
Albumin: 3.8 g/dL (ref 3.5–5.0)
Alkaline Phosphatase: 73 U/L (ref 38–126)
Anion gap: 10 (ref 5–15)
BUN: 18 mg/dL (ref 8–23)
CO2: 21 mmol/L — ABNORMAL LOW (ref 22–32)
Calcium: 9.3 mg/dL (ref 8.9–10.3)
Chloride: 92 mmol/L — ABNORMAL LOW (ref 98–111)
Creatinine, Ser: 0.5 mg/dL (ref 0.44–1.00)
GFR calc Af Amer: >60 mL/min (ref >60)
GFR calc non Af Amer: >60 mL/min (ref >60)
Glucose, Bld: 196 mg/dL — ABNORMAL HIGH (ref 70–99)
Potassium: 4.3 mmol/L (ref 3.5–5.1)
Sodium: 123 mmol/L — ABNORMAL LOW (ref 135–145)
Total Bilirubin: 0.7 mg/dL (ref 0.3–1.2)
Total Protein: 6.5 g/dL (ref 6.5–8.1)

## 2020-03-12 LAB — RAPID URINE DRUG SCREEN, HOSP PERFORMED
Amphetamines: NOT DETECTED
Barbiturates: NOT DETECTED
Benzodiazepines: NOT DETECTED
Cocaine: NOT DETECTED
Opiates: NOT DETECTED
Tetrahydrocannabinol: NOT DETECTED

## 2020-03-12 LAB — TROPONIN I (HIGH SENSITIVITY)
Troponin I (High Sensitivity): 13 ng/L (ref <18)
Troponin I (High Sensitivity): 12 ng/L (ref <18)

## 2020-03-12 LAB — TSH: TSH: 3.89 u[IU]/mL (ref 0.350–4.500)

## 2020-03-12 LAB — GLUCOSE, CAPILLARY
Glucose-Capillary: 193 mg/dL — ABNORMAL HIGH (ref 70–99)
Glucose-Capillary: 201 mg/dL — ABNORMAL HIGH (ref 70–99)
Glucose-Capillary: 178 mg/dL — ABNORMAL HIGH (ref 70–99)
Glucose-Capillary: 233 mg/dL — ABNORMAL HIGH (ref 70–99)
Glucose-Capillary: 232 mg/dL — ABNORMAL HIGH (ref 70–99)

## 2020-03-12 LAB — SODIUM, URINE, RANDOM: Sodium, Ur: <10 mmol/L

## 2020-03-12 LAB — OSMOLALITY, URINE: Osmolality, Ur: 252 mOsm/kg — ABNORMAL LOW (ref 300–900)

## 2020-03-12 LAB — ECHOCARDIOGRAM COMPLETE
Area-P 1/2: 1.93 cm2
Height: 61 in
S' Lateral: 2.8 cm
Weight: 2007.07 oz

## 2020-03-12 MED ORDER — DILTIAZEM HCL-DEXTROSE 125-5 MG/125ML-% IV SOLN (PREMIX)
5.0000 mg/h | INTRAVENOUS | Status: DC
  Administered 2020-03-12: 5 mg/h via INTRAVENOUS
  Filled 2020-03-12: qty 125

## 2020-03-12 MED ORDER — IPRATROPIUM BROMIDE 0.02 % IN SOLN
0.5000 mg | Freq: Three times a day (TID) | RESPIRATORY_TRACT | Status: DC
  Administered 2020-03-12: 0.5 mg via RESPIRATORY_TRACT
  Filled 2020-03-12 (×2): qty 2.5

## 2020-03-12 MED ORDER — POLYSACCHARIDE IRON COMPLEX 150 MG PO CAPS
150.0000 mg | ORAL_CAPSULE | Freq: Every day | ORAL | Status: DC
  Administered 2020-03-12 – 2020-03-16 (×3): 150 mg via ORAL
  Filled 2020-03-12 (×6): qty 1

## 2020-03-12 MED ORDER — LEVALBUTEROL HCL 0.63 MG/3ML IN NEBU
0.6300 mg | INHALATION_SOLUTION | Freq: Three times a day (TID) | RESPIRATORY_TRACT | Status: DC
  Administered 2020-03-12: 0.63 mg via RESPIRATORY_TRACT
  Filled 2020-03-12 (×2): qty 3

## 2020-03-12 MED ORDER — PRAVASTATIN SODIUM 20 MG PO TABS
40.0000 mg | ORAL_TABLET | Freq: Every day | ORAL | Status: DC
  Administered 2020-03-12 – 2020-03-13 (×2): 40 mg via ORAL
  Filled 2020-03-12 (×2): qty 2

## 2020-03-12 MED ORDER — DIGOXIN 125 MCG PO TABS
0.1250 mg | ORAL_TABLET | Freq: Every day | ORAL | Status: DC
  Administered 2020-03-13: 0.125 mg via ORAL
  Filled 2020-03-12: qty 1

## 2020-03-12 MED ORDER — INSULIN ASPART 100 UNIT/ML ~~LOC~~ SOLN
0.0000 [IU] | Freq: Every day | SUBCUTANEOUS | Status: DC
  Administered 2020-03-12: 2 [IU] via SUBCUTANEOUS

## 2020-03-12 MED ORDER — INSULIN ASPART 100 UNIT/ML ~~LOC~~ SOLN
0.0000 [IU] | Freq: Three times a day (TID) | SUBCUTANEOUS | Status: DC
  Administered 2020-03-12 (×2): 3 [IU] via SUBCUTANEOUS
  Administered 2020-03-12: 2 [IU] via SUBCUTANEOUS
  Administered 2020-03-13: 5 [IU] via SUBCUTANEOUS
  Administered 2020-03-13: 9 [IU] via SUBCUTANEOUS

## 2020-03-12 MED ORDER — LEVALBUTEROL HCL 0.63 MG/3ML IN NEBU
0.6300 mg | INHALATION_SOLUTION | Freq: Four times a day (QID) | RESPIRATORY_TRACT | Status: DC | PRN
  Administered 2020-03-13: 0.63 mg via RESPIRATORY_TRACT
  Filled 2020-03-12: qty 3

## 2020-03-12 MED ORDER — MILNACIPRAN HCL 50 MG PO TABS
50.0000 mg | ORAL_TABLET | Freq: Two times a day (BID) | ORAL | Status: DC
  Administered 2020-03-12 – 2020-03-16 (×5): 50 mg via ORAL
  Filled 2020-03-12 (×13): qty 1

## 2020-03-12 MED ORDER — MIRTAZAPINE 15 MG PO TABS
30.0000 mg | ORAL_TABLET | Freq: Every day | ORAL | Status: DC
  Administered 2020-03-12 – 2020-03-16 (×4): 30 mg via ORAL
  Filled 2020-03-12 (×3): qty 2
  Filled 2020-03-12: qty 1
  Filled 2020-03-12: qty 2
  Filled 2020-03-12: qty 1

## 2020-03-12 MED ORDER — DILTIAZEM HCL ER COATED BEADS 120 MG PO CP24
240.0000 mg | ORAL_CAPSULE | Freq: Every day | ORAL | Status: DC
  Administered 2020-03-12: 240 mg via ORAL
  Filled 2020-03-12: qty 1

## 2020-03-12 MED ORDER — MONTELUKAST SODIUM 10 MG PO TABS
10.0000 mg | ORAL_TABLET | Freq: Every day | ORAL | Status: DC
  Administered 2020-03-12 – 2020-03-13 (×3): 10 mg via ORAL
  Filled 2020-03-12 (×3): qty 1

## 2020-03-12 MED ORDER — DIGOXIN 125 MCG PO TABS
0.2500 mg | ORAL_TABLET | ORAL | Status: AC
  Administered 2020-03-12 (×3): 0.25 mg via ORAL
  Filled 2020-03-12 (×3): qty 2

## 2020-03-12 MED ORDER — LEVALBUTEROL HCL 0.63 MG/3ML IN NEBU
0.6300 mg | INHALATION_SOLUTION | Freq: Four times a day (QID) | RESPIRATORY_TRACT | Status: DC
  Administered 2020-03-12: 0.63 mg via RESPIRATORY_TRACT
  Filled 2020-03-12: qty 3

## 2020-03-12 MED ORDER — LORATADINE 10 MG PO TABS
10.0000 mg | ORAL_TABLET | Freq: Every day | ORAL | Status: DC
  Administered 2020-03-12: 10 mg via ORAL
  Filled 2020-03-12: qty 1

## 2020-03-12 MED ORDER — NICOTINE 21 MG/24HR TD PT24
21.0000 mg | MEDICATED_PATCH | Freq: Every day | TRANSDERMAL | Status: DC
  Administered 2020-03-12 – 2020-03-18 (×7): 21 mg via TRANSDERMAL
  Filled 2020-03-12 (×10): qty 1

## 2020-03-12 MED ORDER — IPRATROPIUM BROMIDE 0.02 % IN SOLN
0.5000 mg | Freq: Four times a day (QID) | RESPIRATORY_TRACT | Status: DC
  Administered 2020-03-12: 0.5 mg via RESPIRATORY_TRACT
  Filled 2020-03-12: qty 2.5

## 2020-03-12 NOTE — Evaluation (Signed)
Physical Therapy Evaluation Patient Details Name: Dana [name] MRN: [id number] DOB: 1946-02-27 Today's Date: 03/12/2020   History of Present Illness  74 y.o. female with a hx of COPD, hypertension, DM2, hyperlipidemia, CKD stage II, anemia, depression and fibromyalgia and presenting to hospital for AMS and falls.  Pt found to have new onset atrial fibrillation with RVR and Small right frontal subarachnoid hemorrhage status post recent fall  Clinical Impression  Pt admitted with above diagnosis.  Pt currently with functional limitations due to the deficits listed below (see PT Problem List). Pt will benefit from skilled PT to increase their independence and safety with mobility to allow discharge to the venue listed below.   Pt only willing to ambulate to/from bathroom and holding onto furniture for support. Pt does not like assistive devices and declines to use.  Pt reports her shoe caught the floor at home and this is how she fell, denies syncope type episode.  Pt likely not very active at baseline and also likely to decline HHPT and RW.  Would recommend assist at home due to falls.     Follow Up Recommendations Home health PT;Supervision/Assistance - 24 hour    Equipment Recommendations  Rolling walker with 5" wheels    Recommendations for Other Services       Precautions / Restrictions Precautions Precautions: Fall Precaution Comments: Monitor HR/vitals Restrictions Weight Bearing Restrictions: No      Mobility  Bed Mobility Overal bed mobility: Modified Independent                Transfers Overall transfer level: Needs assistance Equipment used: None Transfers: Sit to/from Stand Sit to Stand: Min guard;Min assist         General transfer comment: min/guard for safety, slight assist from toilet  Ambulation/Gait Ambulation/Gait assistance: Min guard Gait Distance (Feet): 8 Feet (x2) Assistive device: None Gait Pattern/deviations: Step-through  pattern;Decreased stride length;Wide base of support     General Gait Details: pt declined using assistive device instead holding furniture in room, does require UE support, denies doing this at home, HR 140s bpm with mobility  Stairs            Wheelchair Mobility    Modified Rankin (Stroke Patients Only)       Balance Overall balance assessment: History of Falls                                           Pertinent Vitals/Pain Pain Assessment: No/denies pain    Home Living Family/patient expects to be discharged to:: Private residence Living Arrangements: Alone           Home Layout: One level Home Equipment: Gilmer Mor - single point      Prior Function Level of Independence: Independent         Comments: reports she doesn't use assistive device or hold onto walls/furniture at home     Hand Dominance        Extremity/Trunk Assessment        Lower Extremity Assessment Lower Extremity Assessment: Generalized weakness       Communication   Communication: HOH  Cognition Arousal/Alertness: Awake/alert Behavior During Therapy: Flat affect Overall Cognitive Status: Impaired/Different from baseline Area of Impairment: Safety/judgement                         Safety/Judgement: Decreased awareness  of safety;Decreased awareness of deficits            General Comments      Exercises     Assessment/Plan    PT Assessment Patient needs continued PT services  PT Problem List Decreased strength;Decreased activity tolerance;Decreased knowledge of use of DME;Decreased balance;Decreased mobility;Decreased safety awareness       PT Treatment Interventions Gait training;DME instruction;Therapeutic exercise;Balance training;Functional mobility training;Therapeutic activities;Patient/family education    PT Goals (Current goals can be found in the Care Plan section)  Acute Rehab PT Goals PT Goal Formulation: With patient Time  For Goal Achievement: 03/26/20 Potential to Achieve Goals: Good    Frequency Min 3X/week   Barriers to discharge        Co-evaluation               AM-PAC PT "6 Clicks" Mobility  Outcome Measure Help needed turning from your back to your side while in a flat bed without using bedrails?: A Little Help needed moving from lying on your back to sitting on the side of a flat bed without using bedrails?: A Little Help needed moving to and from a bed to a chair (including a wheelchair)?: A Little Help needed standing up from a chair using your arms (e.g., wheelchair or bedside chair)?: A Little Help needed to walk in hospital room?: A Little Help needed climbing 3-5 steps with a railing? : A Little 6 Click Score: 18    End of Session Equipment Utilized During Treatment: Gait belt Activity Tolerance: Patient limited by fatigue Patient left: in bed;with call bell/phone within reach;with bed alarm set Nurse Communication: Mobility status PT Visit Diagnosis: Difficulty in walking, not elsewhere classified (R26.2);Unsteadiness on feet (R26.81)    Time: 1450-1500 PT Time Calculation (min) (ACUTE ONLY): 10 min   Charges:   PT Evaluation $PT Eval Low Complexity: 1 Low         Kati PT, DPT Acute Rehabilitation Services Pager: [phone] Office: [phone]  Sarajane Jews 03/12/2020, 3:38 PM

## 2020-03-12 NOTE — Consult Note (Addendum)
Cardiology Consultation:   Patient ID: Dana [name]; [id number]; 08-04-1946   Admit date: 03/11/2020 Date of Consult: 03/12/2020  Primary Care Provider: [place], Betty G, MD Primary Cardiologist: New to [facility]   Patient Profile:   Dana [name] is a 74 y.o. female with a hx of COPD, hypertension, DM2, hyperlipidemia, CKD stage II, anemia, depression and fibromyalgia who is being seen today for the evaluation of new onset atrial fibrillation with CHF at the request of Dr Cyndia Bent.  History of Present Illness:   Dana [name] is a 74 year old female with a history stated above who presented to [facility] 03/11/2020 with concerns for altered mental status and recurrent falls.  Patient reportedly had a fall approximately 5 days ago. She lives alone and states that she called her son to inform him. She reportedly had significant bleeding from her chin however was overall felt to be stable. She then had another witnessed fall while her daughter in law was at her house bringing her groceries therefore she decided to bring her to the ED for further evaluation. Patient denies LOC or other prodromal symptoms such as dizziness, diaphoresis, nausea, LE edema or chest pain. Patient feels that her foot dragged on the floor causing her to fall.  In the ED, she was noted to be in atrial fibrillation with RVR with rates in the 130 range.  She was given a diltiazem bolus and infusion however this was discontinued secondary to hypotension.  CXR with cardiomegaly and vascular congestion. Head CT found to be significant for small frontal SAH therefore neurosurgery was consulted with recommendations for repeat head CT and no surgical intervention. No anticoagulation was started for PAF secondary to above.  Cardiology has been asked to follow given new onset atrial fibrillation with RVR along with CHF.  Past Medical History:  Diagnosis Date  . Asthma   . Depression   . Diabetes mellitus without complication (HCC)     . Emphysema of lung (HCC)   . Heart disease   . Hyperlipidemia   . Hypertension   . Irritable bowel syndrome     Past Surgical History:  Procedure Laterality Date  . ABDOMINAL HYSTERECTOMY    . bladder tack    . CHOLECYSTECTOMY       Prior to Admission medications   Medication Sig Start Date End Date Taking? Authorizing Provider  aspirin 81 MG tablet Take 81 mg by mouth daily.   Yes [provider]  calcium citrate-vitamin D 500-400 MG-UNIT chewable tablet Chew 1 tablet by mouth daily.   Yes [provider]  cetirizine (ZYRTEC) 10 MG tablet Take 1 tablet (10 mg total) by mouth daily. 07/02/18  Yes [place], Betty G, MD  Cranberry 500 MG CAPS Take 500 mg by mouth daily.   Yes [provider]  desloratadine (CLARINEX) 5 MG tablet TAKE 1 TABLET DAILY Patient taking differently: Take 5 mg by mouth daily.  12/30/19  Yes [place], Betty G, MD  iron polysaccharides (NIFEREX) 150 MG capsule Take 150 mg by mouth daily.   Yes [provider]  JANUVIA 50 MG tablet TAKE 1 TABLET DAILY Patient taking differently: Take 50 mg by mouth daily.  11/13/19  Yes [place], Betty G, MD  LIVALO 2 MG TABS TAKE 1 TABLET DAILY Patient taking differently: Take 2 mg by mouth daily.  12/31/19  Yes [place], Betty G, MD  losartan (COZAAR) 25 MG tablet TAKE 1 TABLET DAILY Patient taking differently: Take 25 mg by mouth daily.  12/10/19  Yes [place], Betty G, MD  metFORMIN (GLUCOPHAGE) 1000 MG tablet TAKE 1 TABLET TWICE A DAY WITH MEALS Patient taking differently: Take 1,000 mg by mouth 2 (two) times daily with a meal.  09/27/19  Yes [place], Betty G, MD  metoprolol succinate (TOPROL-XL) 50 MG 24 hr tablet TAKE 1 TABLET DAILY WITH OR IMMEDIATELY FOLLOWING A MEAL Patient taking differently: Take 50 mg by mouth daily.  12/23/19  Yes [place], Betty G, MD  mirtazapine (REMERON) 30 MG tablet TAKE 1 TABLET AT BEDTIME Patient taking differently: Take 30 mg by mouth at bedtime.  06/28/19  Yes [place],  Betty G, MD  montelukast (SINGULAIR) 10 MG tablet TAKE 1 TABLET AT BEDTIME Patient taking differently: Take 10 mg by mouth at bedtime.  12/30/19  Yes [place], Betty G, MD  SAVELLA 50 MG TABS tablet TAKE 1 TABLET EVERY 12 HOURS Patient taking differently: Take 50 mg by mouth 2 (two) times daily.  06/28/19  Yes [place], Betty G, MD  vitamin C (ASCORBIC ACID) 500 MG tablet Take 500 mg by mouth daily.   Yes [provider]  vitamin E (VITAMIN E) 400 UNIT capsule Take 400 Units by mouth daily.   Yes [provider]    Inpatient Medications: Scheduled Meds: . insulin aspart  0-5 Units Subcutaneous QHS  . insulin aspart  0-9 Units Subcutaneous TID WC  . ipratropium  0.5 mg Nebulization Q6H  . iron polysaccharides  150 mg Oral Daily  . levalbuterol  0.63 mg Nebulization Q6H  . loratadine  10 mg Oral Daily  . Milnacipran  50 mg Oral BID  . mirtazapine  30 mg Oral QHS  . montelukast  10 mg Oral QHS  . nicotine  21 mg Transdermal Once  . pravastatin  40 mg Oral q1800   Continuous Infusions: . diltiazem (CARDIZEM) infusion 7.5 mg/hr (03/12/20 0910)   PRN Meds:   Allergies:    Allergies  Allergen Reactions  . Bactrim [Sulfamethoxazole-Trimethoprim] Hives    Social History:   Social History   Socioeconomic History  . Marital status: Widowed    Spouse name: Not on file  . Number of children: 1  . Years of education: Not on file  . Highest education level: Not on file  Occupational History  . Not on file  Tobacco Use  . Smoking status: Current Every Day Smoker    Types: Cigarettes  . Smokeless tobacco: Never Used  Vaping Use  . Vaping Use: Never used  Substance and Sexual Activity  . Alcohol use: Not Currently  . Drug use: Not Currently  . Sexual activity: Not Currently  Other Topics Concern  . Not on file  Social History Narrative  . Not on file   Social Determinants of Health   Financial Resource Strain:   . Difficulty of Paying Living Expenses:     Food Insecurity:   . Worried About Programme researcher, broadcasting/film/video in the Last Year:   . Barista in the Last Year:   Transportation Needs:   . Freight forwarder (Medical):   Marland Kitchen Lack of Transportation (Non-Medical):   Physical Activity:   . Days of Exercise per Week:   . Minutes of Exercise per Session:   Stress:   . Feeling of Stress :   Social Connections:   . Frequency of Communication with Friends and Family:   . Frequency of Social Gatherings with Friends and Family:   . Attends Religious Services:   . Active Member of  Clubs or Organizations:   . Attends Banker Meetings:   Marland Kitchen Marital Status:   Intimate Partner Violence:   . Fear of Current or Ex-Partner:   . Emotionally Abused:   Marland Kitchen Physically Abused:   . Sexually Abused:     Family History:   Family History  Problem Relation Age of Onset  . Heart disease Mother   . Hypertension Mother   . Heart attack Father   . Heart disease Father   . Hyperlipidemia Father   . Hypertension Father   . Hypertension Sister   . Cancer Brother   . Depression Son   . Heart attack Son   . Heart disease Son   . Hyperlipidemia Son   . Hypertension Son   . Kidney disease Son   . Learning disabilities Son   . Mental illness Son   . Heart disease Sister   . Heart attack Sister   . Heart disease Sister   . Heart disease Brother   . Heart attack Brother   . Mental illness Brother   . Heart disease Brother   . Mental illness Brother    Family Status:  Family Status  Relation Name Status  . Mother  Deceased  . Father  Deceased  . Sister LUCILLE Deceased  . Brother ED Deceased  . Son [facility] Alive  . MGM  Deceased  . MGF  Deceased  . PGM  Deceased  . PGF  Deceased  . Sister [facility] Deceased  . Sister EVE Alive  . Brother CARL Deceased  . Brother FRED Deceased    ROS:  Please see the history of present illness.  All other ROS reviewed and negative.     Physical Exam/Data:   Vitals:   03/12/20 0529 03/12/20  0735 03/12/20 0909 03/12/20 1128  BP: (!) 99/61 (!) 109/61 (!) 125/94 98/74  Pulse: 80 (!) 122 (!) 120 (!) 107  Resp: 16 16    Temp: 97.9 F (36.6 C) 97.9 F (36.6 C) (!) 97.5 F (36.4 C) 97.6 F (36.4 C)  TempSrc: Oral Oral Axillary Oral  SpO2: 90% 96% 98% 94%  Weight:      Height:        Intake/Output Summary (Last 24 hours) at 03/12/2020 1136 Last data filed at 03/12/2020 1000 Gross per 24 hour  Intake 1360 ml  Output 300 ml  Net 1060 ml   Filed Weights   03/11/20 1849  Weight: 56.9 kg   Body mass index is 23.7 kg/m.   General: Frail, NAD Skin: Warm, dry, intact. Significant chin bruising noted  Neck: Negative for carotid bruits. No JVD Lungs:Clear to ausculation bilaterally. Breathing is unlabored. Cardiovascular: Irregularly irregular  Abdomen: Soft, non-tender, non-distended. No obvious abdominal masses. Extremities: No edema. Radial pulses 2+ bilaterally Neuro: Alert and oriented. No focal deficits. No facial asymmetry. MAE spontaneously. Psych: Responds to questions appropriately with normal affect.    EKG:  The EKG was personally reviewed and demonstrates: 03/11/20 AF with HR 121bpm and no acute ST changes  Telemetry:  Telemetry was personally reviewed and demonstrates: 03/12/20 AF with rates in the 90-100 range   Relevant CV Studies:  ECHO: Pending   Laboratory Data:  Chemistry Recent Labs  Lab 03/11/20 2035 03/12/20 0052 03/12/20 0404  NA 119* 122* 123*  K 5.8* 5.0 4.3  CL 87* 91* 92*  CO2 22 21* 21*  GLUCOSE 220* 198* 196*  BUN CREATININE 0.61 0.55 0.50  CALCIUM 9.5 9.2  9.3  GFRNONAA >60 >60 >60  GFRAA >60 >60 >60  ANIONGAP Total Protein  Date Value Ref Range Status  03/12/2020 6.5 6.5 - 8.1 g/dL Final   Albumin  Date Value Ref Range Status  03/12/2020 3.8 3.5 - 5.0 g/dL Final   AST  Date Value Ref Range Status  03/12/2020 18 15 - 41 U/L Final   ALT  Date Value Ref Range Status  03/12/2020 20 0 - 44 U/L  Final   Alkaline Phosphatase  Date Value Ref Range Status  03/12/2020 73 38 - 126 U/L Final   Total Bilirubin  Date Value Ref Range Status  03/12/2020 0.7 0.3 - 1.2 mg/dL Final   Hematology Recent Labs  Lab 03/11/20 2035 03/12/20 0052  WBC 8.3 6.9  RBC 4.66 4.32  HGB 15.0 13.6  HCT 43.5 40.7  MCV 93.3 94.2  MCH 32.2 31.5  MCHC 34.5 33.4  RDW 13.4 13.3  PLT 308 259   Cardiac EnzymesNo results for input(s): TROPONINI in the last 168 hours. No results for input(s): TROPIPOC in the last 168 hours.  BNPNo results for input(s): BNP, PROBNP in the last 168 hours.  DDimer No results for input(s): DDIMER in the last 168 hours. TSH:  Lab Results  Component Value Date   TSH 3.890 03/12/2020   Lipids: Lab Results  Component Value Date   CHOL 109 12/20/2019   HDL 35.90 (L) 12/20/2019   LDLCALC 54 12/20/2019   TRIG 99.0 12/20/2019   CHOLHDL 3 12/20/2019   HgbA1c: Lab Results  Component Value Date   HGBA1C 6.8 (H) 03/12/2020    Radiology/Studies:  DG Chest 2 View  Result Date: 03/11/2020 CLINICAL DATA:  Fall EXAM: CHEST - 2 VIEW COMPARISON:  None. FINDINGS: Small bilateral pleural effusions. Mild cardiomegaly with central congestion. Mild diffuse bilateral interstitial and ground-glass opacity. Aortic atherosclerosis. No pneumothorax. IMPRESSION: Mild cardiomegaly with central vascular congestion and small pleural effusions. Mild diffuse interstitial and ground-glass opacity, probably reflecting mild pulmonary edema, less likely atypical infection. Electronically Signed   By: Jasmine Pang M.D.   On: 03/11/2020 19:49   DG Pelvis 1-2 Views  Result Date: 03/11/2020 CLINICAL DATA:  Fall EXAM: PELVIS - 1-2 VIEW COMPARISON:  None. FINDINGS: There is no evidence of pelvic fracture or diastasis. No pelvic bone lesions are seen. Vascular calcifications. IMPRESSION: Negative. Electronically Signed   By: Jasmine Pang M.D.   On: 03/11/2020 19:49   CT HEAD WO CONTRAST  Result Date:  03/12/2020 CLINICAL DATA:  Subarachnoid hemorrhage follow-up EXAM: CT HEAD WITHOUT CONTRAST TECHNIQUE: Contiguous axial images were obtained from the base of the skull through the vertex without intravenous contrast. COMPARISON:  03/11/2020 FINDINGS: Brain: Small volume subarachnoid hemorrhage is again identified along the right central sulcus. No new hemorrhage. Ventricles are stable in size. Stable findings of chronic microvascular ischemic changes. Gray-white differentiation remains preserved. Vascular: There is atherosclerotic calcification at the skull base. Skull: Calvarium is unremarkable. Sinuses/Orbits: Opacified partially imaged left frontal, maxillary, and anterior ethmoid sinuses. Obstructing ostiomeatal unit lesion such as a polyp is not excluded. No acute orbital abnormality. Other: Mastoid air cells are clear. IMPRESSION: Stable small volume sulcal subarachnoid hemorrhage. No new findings. Electronically Signed   By: Guadlupe Spanish M.D.   On: 03/12/2020 10:04   CT Head Wo Contrast  Result Date: 03/11/2020 CLINICAL DATA:  74 year old female with facial trauma. EXAM: CT HEAD WITHOUT CONTRAST CT MAXILLOFACIAL WITHOUT CONTRAST CT CERVICAL SPINE WITHOUT CONTRAST TECHNIQUE:  Multidetector CT imaging of the head, cervical spine, and maxillofacial structures were performed using the standard protocol without intravenous contrast. Multiplanar CT image reconstructions of the cervical spine and maxillofacial structures were also generated. COMPARISON:  None. FINDINGS: Evaluation of this exam is limited due to motion artifact. CT HEAD FINDINGS Brain: There is moderate age-related atrophy and chronic microvascular ischemic changes. There is a small right frontal convexity subarachnoid hemorrhage. No mass effect or midline shift. Vascular: No hyperdense vessel or unexpected calcification. Skull: Normal. Negative for fracture or focal lesion. Other: None CT MAXILLOFACIAL FINDINGS Osseous: No acute fracture. No  mandibular subluxation. Orbits: The globes and retro-orbital fat are preserved. Sinuses: Chronic appearing complete opacification of the left maxillary sinus and ethmoid air cells with remodeling of the surrounding bones. No air-fluid level. The mastoid air cells are clear. Soft tissues: Negative. CT CERVICAL SPINE FINDINGS Alignment: No acute subluxation. There is reversal of normal cervical lordosis which may be positional or due to chronic spasm. Skull base and vertebrae: No acute fracture. Evaluation however is limited due to respiratory motion artifact. Soft tissues and spinal canal: No prevertebral fluid or swelling. No visible canal hematoma. Disc levels: Multilevel degenerative changes with multilevel facet arthropathy. Upper chest: Partially visualized right pleural effusion. Other: Bilateral carotid bulb calcified plaques. IMPRESSION: 1. Small right frontal convexity subarachnoid hemorrhage. 2. No acute/traumatic cervical spine pathology. 3. No acute facial bone fractures. 4. Chronic left paranasal sinus disease. These results were called by telephone at the time of interpretation on 03/11/2020 at 9:09 pm to provider [facility] Management[facility]IE HAVILAND , who verbally acknowledged these results. Electronically Signed   By: Elgie CollardArash  Radparvar M.D.   On: 03/11/2020 21:08   CT Cervical Spine Wo Contrast  Result Date: 03/11/2020 CLINICAL DATA:  74 year old female with facial trauma. EXAM: CT HEAD WITHOUT CONTRAST CT MAXILLOFACIAL WITHOUT CONTRAST CT CERVICAL SPINE WITHOUT CONTRAST TECHNIQUE: Multidetector CT imaging of the head, cervical spine, and maxillofacial structures were performed using the standard protocol without intravenous contrast. Multiplanar CT image reconstructions of the cervical spine and maxillofacial structures were also generated. COMPARISON:  None. FINDINGS: Evaluation of this exam is limited due to motion artifact. CT HEAD FINDINGS Brain: There is moderate age-related atrophy and chronic microvascular  ischemic changes. There is a small right frontal convexity subarachnoid hemorrhage. No mass effect or midline shift. Vascular: No hyperdense vessel or unexpected calcification. Skull: Normal. Negative for fracture or focal lesion. Other: None CT MAXILLOFACIAL FINDINGS Osseous: No acute fracture. No mandibular subluxation. Orbits: The globes and retro-orbital fat are preserved. Sinuses: Chronic appearing complete opacification of the left maxillary sinus and ethmoid air cells with remodeling of the surrounding bones. No air-fluid level. The mastoid air cells are clear. Soft tissues: Negative. CT CERVICAL SPINE FINDINGS Alignment: No acute subluxation. There is reversal of normal cervical lordosis which may be positional or due to chronic spasm. Skull base and vertebrae: No acute fracture. Evaluation however is limited due to respiratory motion artifact. Soft tissues and spinal canal: No prevertebral fluid or swelling. No visible canal hematoma. Disc levels: Multilevel degenerative changes with multilevel facet arthropathy. Upper chest: Partially visualized right pleural effusion. Other: Bilateral carotid bulb calcified plaques. IMPRESSION: 1. Small right frontal convexity subarachnoid hemorrhage. 2. No acute/traumatic cervical spine pathology. 3. No acute facial bone fractures. 4. Chronic left paranasal sinus disease. These results were called by telephone at the time of interpretation on 03/11/2020 at 9:09 pm to provider [facility] HAVILAND , who verbally acknowledged these results. Electronically Signed   By: Burtis JunesArash  Radparvar M.D.   On: 03/11/2020 21:08   CT Maxillofacial Wo Contrast  Result Date: 03/11/2020 CLINICAL DATA:  74 year old female with facial trauma. EXAM: CT HEAD WITHOUT CONTRAST CT MAXILLOFACIAL WITHOUT CONTRAST CT CERVICAL SPINE WITHOUT CONTRAST TECHNIQUE: Multidetector CT imaging of the head, cervical spine, and maxillofacial structures were performed using the standard protocol without intravenous  contrast. Multiplanar CT image reconstructions of the cervical spine and maxillofacial structures were also generated. COMPARISON:  None. FINDINGS: Evaluation of this exam is limited due to motion artifact. CT HEAD FINDINGS Brain: There is moderate age-related atrophy and chronic microvascular ischemic changes. There is a small right frontal convexity subarachnoid hemorrhage. No mass effect or midline shift. Vascular: No hyperdense vessel or unexpected calcification. Skull: Normal. Negative for fracture or focal lesion. Other: None CT MAXILLOFACIAL FINDINGS Osseous: No acute fracture. No mandibular subluxation. Orbits: The globes and retro-orbital fat are preserved. Sinuses: Chronic appearing complete opacification of the left maxillary sinus and ethmoid air cells with remodeling of the surrounding bones. No air-fluid level. The mastoid air cells are clear. Soft tissues: Negative. CT CERVICAL SPINE FINDINGS Alignment: No acute subluxation. There is reversal of normal cervical lordosis which may be positional or due to chronic spasm. Skull base and vertebrae: No acute fracture. Evaluation however is limited due to respiratory motion artifact. Soft tissues and spinal canal: No prevertebral fluid or swelling. No visible canal hematoma. Disc levels: Multilevel degenerative changes with multilevel facet arthropathy. Upper chest: Partially visualized right pleural effusion. Other: Bilateral carotid bulb calcified plaques. IMPRESSION: 1. Small right frontal convexity subarachnoid hemorrhage. 2. No acute/traumatic cervical spine pathology. 3. No acute facial bone fractures. 4. Chronic left paranasal sinus disease. These results were called by telephone at the time of interpretation on 03/11/2020 at 9:09 pm to provider [facility] , who verbally acknowledged these results. Electronically Signed   By: Elgie Collard M.D.   On: 03/11/2020 21:08   Assessment and Plan:   1.  New onset atrial fibrillation with  RVR: -Patient presented to [facility] 03/11/2020 after several recurrent falls and found to have SAH per head CT. On ED presentation, patient found to be in new onset atrial fibrillation with RVR. Initially treated with IV diltiazem bolus and infusion however she became hypotensive therefore this was held then restarted due to rising rates. No anticoagulation was started secondary to subdural hemorrhage per primary and neurosurgery recommendation. -Echocardiogram with pending results  -TSH, WNL at 3.890 -BP 98/74, 125/94, 109/61 -Will plan for rate control for rate control for now. Once able to start anticoagulation per neurosurgery and primary teams, will anticoagulate for 3 to 4 weeks then repeat EKG.  If patient remains in atrial fibrillation, consider OP DCCV. Other option is for TEE/DCCV while inpatient  -Medication plan pending LV function on echo  -Currently on IV diltiazem at 7.5mg /hr -On PTA Toprol XL 50 QD  -Could also consider restarting BB therapy and stopping diltiazem    2.  Small right frontal subarachnoid hemorrhage status post recent fall: -As above, patient presented after several recurrent falls, initial fall causing injury felt to be approximately 5 days ago. Repeat head CT today per neurosurgery with no progression  -Hold off on AC until ok per primary and neur  3.  Hypotension: -Felt to be secondary to diltiazem, initially started for the treatment of atrial fibrillation with RVR -Hypotension resolved with IV fluids per primary team -PTA ACEI/metoprolol currently held -Currently on IV Diltiazem at 7.5mg /hr with stable but soft BPs    Other  medical issues per primary team include: -Acute on chronic hyponatremia -Hyperkalemia -DM2 -Tobacco use -Depression -Fibromyalgia -Anemia -COPD  For questions or updates, please contact [facility] Please consult [email or web address] for contact info under Cardiology/STEMI.   SignedGeorgie Chard NP-C [facility] Pager:  [phone] 03/12/2020 11:36 AM  I have seen and examined the patient along with Georgie Chard NP-C.  I have reviewed the chart, notes and new data.  I agree with PA/NP's note.  Key new complaints: denies dyspnea, lying fully supine in bed or with activity, unaware of arrhythmia, no edema. Denies that her falls were in any way associated with dizziness or syncope Key examination changes: irregular rhythm, no murmurs/rumble. No JVD, no edema, clear lungs. Ecchymosis of chin. Non focal neuro exam Key new findings / data: Afib w RVR; echo reviewed, Probably moderate degenerative MS,  Gradients exaggerated due to tachycardia. Normal LVEF.  PLAN: Rate control will require adding low dose digoxin, as her BP limits dose of diltiazem. Will load dig and transition to PO meds. CHADSVasc at least 5 (age 64, gender, HTN, DM). Unable to anticoagulate due to [facility], recent falls. Appearance of mitral valve suggests MS is not due to rheumatic disease and MS is not severe, so I do not think that we have to use warfarin. Long-term, should be on DOAC if falls can be prevented. Hyponatremia may be contributing to falls. Hyponatremia and hypokalemia and relative hypotension raise possibility of adrenal insufficiency.  Thurmon Fair, MD, [facility] [phone] 03/12/2020, 1:43 PM

## 2020-03-12 NOTE — ED Notes (Signed)
Son updated on pts care at this time.

## 2020-03-12 NOTE — Progress Notes (Signed)
   03/12/20 0735  Assess: MEWS Score  Temp 97.9 F (36.6 C)  BP (!) 109/61  Pulse Rate (!) 122  ECG Heart Rate (!) 122  Resp 16  Level of Consciousness Alert  SpO2 96 %  O2 Device Room Air  Assess: MEWS Score  MEWS Temp 0  MEWS Systolic 0  MEWS Pulse 2  MEWS RR 0  MEWS LOC 0  MEWS Score 2  MEWS Score Color Yellow  Assess: if the MEWS score is Yellow or Red  Were vital signs taken at a resting state? Yes  Focused Assessment Change from prior assessment (see assessment flowsheet)  Early Detection of Sepsis Score *See Row Information* Medium  MEWS guidelines implemented *See Row Information* Yes  Treat  Pain Scale 0-10  Pain Score 0  Take Vital Signs  Increase Vital Sign Frequency  Yellow: Q 2hr X 2 then Q 4hr X 2, if remains yellow, continue Q 4hrs  Escalate  MEWS: Escalate Yellow: discuss with charge nurse/RN and consider discussing with provider and RRT  Notify: Charge Nurse/RN  Name of Charge Nurse/RN Notified Shanda Bumps   Date Charge Nurse/RN Notified 03/12/20  Time Charge Nurse/RN Notified 0803  Notify: Provider  Provider Name/Title [facility]  Date Provider Notified 03/12/20  Time Provider Notified [phone]  Notification Type Page  Notification Reason Change in status  Response See new orders  Date of Provider Response 03/12/20  Time of Provider Response [phone]

## 2020-03-12 NOTE — Evaluation (Signed)
Occupational Therapy Evaluation Patient Details Name: Dana [name] MRN: [id number] DOB: 1945/10/27 Today's Date: 03/12/2020    History of Present Illness 74 y.o. female with a hx of COPD, hypertension, DM2, hyperlipidemia, CKD stage II, anemia, depression and fibromyalgia and presenting to hospital for AMS and falls.  Pt found to have new onset atrial fibrillation with RVR and Small right frontal subarachnoid hemorrhage status post recent fall   Clinical Impression   An occupational therapy evaluation was completed on this 74 year old, right handed,  female with pertinent past medical history of COPD, HTN, DM II, CKD II, IBS, fibromyalgia, and depression, as well as PMH below. Patient is currently requiring setup/supervision assist with seated ADLs and contact guard to minimal assist with standing ADLs including LE dressing, bathing, and toileting, all of which is below patient's typical baseline of being Independent. ?During this evaluation, patient was limited by soreness to LT rib area and rt hand, impaired static and dynamic standing balance necessitating use of a RW, and limited activity tolerance vs motivation with pt declining ambulation or being up in chair, all of which has the potential to impact patient's safety and independence during functional mobility, as well as performance for ADLs. ?[employer] AM-PAC "6-clicks" Daily Activity Inpatient Short Form score of ?19/24 indicates 42.80% ADL impairment this session. Patient lives alone, and has a son who is able to provide PRN supervision and assistance.  Patient demonstrates fair rehab potential due to decreased motivation for increased activity, but should benefit from continued skilled occupational therapy services while in acute care to maximize safety, independence and quality of life at home.   ?     Follow Up Recommendations  No OT follow up    Equipment Recommendations    RW   Recommendations for Other Services        Precautions / Restrictions Precautions Precautions: Fall Precaution Comments: Monitor HR/vitals Restrictions Weight Bearing Restrictions: No      Mobility Bed Mobility Overal bed mobility: Modified Independent             General bed mobility comments: Supine to sit and sit to supine.  Transfers Overall transfer level: Needs assistance Equipment used: Rolling walker (2 wheeled) (Initially none, added RW as pt felt unsteady holding bed rail with one hand.) Transfers: Sit to/from Stand Sit to Stand: Min guard         General transfer comment: To/from EOB    Balance Overall balance assessment: History of Falls;Needs assistance Sitting-balance support: No upper extremity supported Sitting balance-Leahy Scale: Good     Standing balance support: Single extremity supported Standing balance-Leahy Scale: Fair Standing balance comment: Pt expressed feeling unsteady. Increased steadiness with RW and pt marched in place with BUE support with SBA.                           ADL either performed or assessed with clinical judgement   ADL Overall ADL's : Needs assistance/impaired Eating/Feeding: Modified independent Eating/Feeding Details (indicate cue type and reason): Due to dominent hand swelling and pain Grooming: Set up;Sitting;Wash/dry hands   Upper Body Bathing: Sitting;Set up;Supervision/ safety Upper Body Bathing Details (indicate cue type and reason): Based on general assessment. Lower Body Bathing: Sitting/lateral leans;Sit to/from stand;Min guard Lower Body Bathing Details (indicate cue type and reason): Need of unilatearl UE support with static standing and Bil UE support on walker with dynamic standing. Upper Body Dressing : Minimal assistance Upper Body Dressing Details (  indicate cue type and reason): Due to RT hand tenderness. Lower Body Dressing: Set up;Sitting/lateral leans Lower Body Dressing Details (indicate cue type and reason): Pt able  to demo figure 4 position each leg at EOB. Toilet Transfer: Immunologist Details (indicate cue type and reason): Based on general assessment. Toileting- Clothing Manipulation and Hygiene: Sitting/lateral lean;Sit to/from stand;Min guard Toileting - Clothing Manipulation Details (indicate cue type and reason): Based on general assessment.  Pt denied need to void.     Functional mobility during ADLs: Min guard;Rolling walker       Vision Baseline Vision/History: No visual deficits Patient Visual Report: No change from baseline Vision Assessment?: No apparent visual deficits Additional Comments: Pt reports having implanted contacts.     Perception     Praxis      Pertinent Vitals/Pain Pain Assessment: Faces Faces Pain Scale: Hurts little more Pain Location: 2/10 RT hand.  Pt reports chin only hurts if she touches it.  4/10 LT rib area when transitioning in bed. Pain Descriptors / Indicators: Sore Pain Intervention(s): Limited activity within patient's tolerance;Repositioned     Hand Dominance Right   Extremity/Trunk Assessment Upper Extremity Assessment Upper Extremity Assessment: LUE deficits/detail;RUE deficits/detail RUE Deficits / Details: AROM: WFL.  MMT WFL except hand which is red and swollen. +Composite grip, but no MMT due to soreness. RUE Sensation: WNL RUE Coordination:  (impeeded due to swelling and tenderness) LUE Deficits / Details: AROM: WFL. MMT: Shoulder 3+/5 with increased pain testing.  Elbow to hand: WNL. LUE Sensation: WNL LUE Coordination: WNL   Lower Extremity Assessment Lower Extremity Assessment: Defer to PT evaluation   Cervical / Trunk Assessment Cervical / Trunk Assessment: Normal   Communication Communication Communication: HOH   Cognition Arousal/Alertness: Awake/alert Behavior During Therapy: Flat affect Overall Cognitive Status: Impaired/Different from baseline Area of Impairment: Orientation;Safety/judgement                  Orientation Level: Disoriented to;Place;Time (Pt knew "hospital" and "[place]". Unable to provide month, but aware of year.)       Safety/Judgement: Decreased awareness of safety;Decreased awareness of deficits     General Comments: Pt reports that she is only in the hospital because her son forced her to be here. Pt has some decreased awareness of current impairments and is downplaying her recent falls and injury. .   General Comments                  Home Living Family/patient expects to be discharged to:: Private residence (house) Living Arrangements: Alone Available Help at Discharge: Family;Other (Comment) (son) Type of Home: House Home Access: Stairs to enter [employer] of Steps: 2-3 Entrance Stairs-Rails: None Home Layout: One level     Bathroom Shower/Tub: Producer, television/film/video: Standard     Home Equipment: Cane - single point          Prior Functioning/Environment Level of Independence: Independent        Comments: Pt reports that prior to admission she ambulates without AD unlimited distances. Pt Ind with ADLs. Has a housekeeper every other week to assist with cleaning, and hires out yard work. Pt does not drive, and son provides transportation.  Pt denies previous falls prior to these 2 recent.        OT Problem List: Decreased strength;Pain;Increased edema;Decreased safety awareness;Decreased activity tolerance;Impaired balance (sitting and/or standing);Decreased knowledge of use of DME or AE;Decreased knowledge of precautions  OT Treatment/Interventions: Self-care/ADL training;Therapeutic exercise;Therapeutic activities;Energy conservation;DME and/or AE instruction;Patient/family education;Balance training    OT Goals(Current goals can be found in the care plan section) Acute Rehab OT Goals Patient Stated Goal: To go home. OT Goal Formulation: With patient Time For Goal Achievement: 03/26/20 Potential to  Achieve Goals: Fair ADL Goals Pt Will Perform Upper Body Bathing: with modified independence;standing;sitting Pt Will Perform Lower Body Bathing: with modified independence;sit to/from stand;sitting/lateral leans Pt Will Perform Lower Body Dressing: with modified independence;sit to/from stand;sitting/lateral leans Pt Will Transfer to Toilet: with modified independence;ambulating;regular height toilet Pt Will Perform Toileting - Clothing Manipulation and hygiene: with modified independence;sitting/lateral leans;sit to/from stand Pt Will Perform Tub/Shower Transfer: with modified independence Additional ADL Goal #1: Pt will verbalize at least 3 ways to prevent future falls at home in order to prevent further injury and rehospitalization.  OT Frequency: Min 2X/week   Barriers to D/C: Decreased caregiver support  Pt lives alone       Co-evaluation              AM-PAC OT "6 Clicks" Daily Activity     Outcome Measure Help from another person eating meals?: None Help from another person taking care of personal grooming?: A Little Help from another person toileting, which includes using toliet, bedpan, or urinal?: A Little Help from another person bathing (including washing, rinsing, drying)?: A Little Help from another person to put on and taking off regular upper body clothing?: A Little Help from another person to put on and taking off regular lower body clothing?: A Little 6 Click Score: 19   End of Session Equipment Utilized During Treatment: Gait belt;Rolling walker Nurse Communication: Mobility status  Activity Tolerance: Patient tolerated treatment well;Patient limited by fatigue Patient left: in bed;with bed alarm set;with call bell/phone within reach (Pt refused chair)  OT Visit Diagnosis: Unsteadiness on feet (R26.81);History of falling (Z91.81);Pain Pain - Right/Left: Right (chin and rt hand) Pain - part of body: Hand                Time: 1511-1531 OT Time Calculation  (min): 20 min Charges:  OT General Charges $OT Visit: 1 Visit OT Evaluation $OT Eval Low Complexity: 1 Low  Velecia Ovitt, OT Acute Rehab Services Office: [phone] 03/12/2020   Theodoro Clock 03/12/2020, 3:56 PM

## 2020-03-12 NOTE — Progress Notes (Signed)
PROGRESS NOTE    Dana [name]  ZOX:[id number] DOB: [birth year] DOA: 03/11/2020 PCP: [place], Betty G, MD   Brief Narrative:  HPI per Dr. Benita Gutter on 03/11/20 Dana [name] is a 74 y.o. female with medical history significant for COPD, hypertension, type 2 diabetes, hyperlipidemia, chronic kidney disease stage II, iron deficiency anemia, depression and fibromyalgia who presents with concerns of altered mental status/recurrent falls.  Patient reportedly had a fall about 5 days ago.  She lives alone but son received a phone call that she had fallen.  When son arrived she was sitting up on the floor but had a lot of bleeding coming from a laceration to her chin.  She does not recall why she fell but thinks that is because she is weak.  Daughter-in-law went to bring her groceries today and saw her fall onto her couch and decided to bring her into the ED for evaluation. Son reports that she chronically has weakness and had brought her a walker in the past but she does not use it.  She does not have history of frequent falls.  She denies feeling any dizziness or lightheadedness.  No chest pain or shortness of breath.  No nausea, vomiting or diarrhea.  Denies any fever.  In the ED, patient was noted to be in atrial fibrillation with RVR with rates up to 130 which is new for her.  She was given diltiazem bolus but infusion was discontinued since she became hypotensive.  Chest x-ray notable for cardiomegaly and vascular congestion.  Son reports that she has history of mitral valve stenosis diagnosed about 3 to 4 years ago.  CT head was significant for a small frontal subarachnoid hemorrhage.  Neurosurgery has been consulted and recommend repeat CT head in the morning with no surgical intervention.  Will need to hold off on any blood thinner or anticoagulation.  Patient also noted to be hyponatremic with a corrected sodium of 121.  She appears to be chronically hyponatremic with baseline around  127.  Son is unsure whether she has decreased p.o. intake but does mention that patient likes to chew a lot of ice.  Patient denies any alcohol use.  **Interim History  Patient was in A. fib with RVR today and currently asymptomatic but had uncontrolled heart rates.  She is limited by hypotension so cardiology was consulted and they recommended digoxin.  Unable to safely anticoagulate given her subarachnoid hemorrhage but later on long-term she should be on a DOAC if falls can be prevented per cardiology recommendations.  Cardiogram done and showed that the left ventricular diastolic parameters were indeterminate and she had an EF of 50 to 55%.  The right ventricular systolic function was mildly reduced and the mitral valve was degenerative with severe mitral annular calcification and severe mitral stenosis.  Assessment & Plan:   Principal Problem:   Subarachnoid bleed (HCC) Active Problems:   Type 2 diabetes mellitus with diabetic neuropathy, without long-term current use of insulin (HCC)   Major depressive disorder in remission (HCC)   Fibromyalgia   COPD (chronic obstructive pulmonary disease) (HCC)   Tobacco use disorder   Hyponatremia   Iron deficiency anemia   Hypotension   Fall at home, initial encounter   Atrial fibrillation with RVR (HCC)   Hyperkalemia  Small right frontal subarachnoid hemorrhage s/p fall -Repeat CT head in the morning per neurosurgery showed "Stable small volume sulcal subarachnoid hemorrhage. No new findings." -C/w Frequent neuro checks -Neurosurgery feels that she is out  of the window from initial injury from an acute standpoint and recommending no anticoagulants -Appreciate neurosurgery evaluation  Recurrent fall -Unclear mechanical versus metabolic or from new atrial fibrillation -Keep on telemetry -PT eval recommending home health PT -TSH was relatively unremarkable and is 3.890  New onset atrial fibrillation with RVR -Rate controlled following  bolus of diltiazem but became hypotensive; blood pressure is improved so she is placed on a diltiazem drip now transitioned off by cardiology and cardiology started on p.o. digoxin and p.o. diltiazem -Cardiology started her on 0.025 mg of digoxin every 2 hours for 3 doses and then started her on 0.125 mg p.o. daily along with Cardizem 240 mg CD daily -obtained echo and is as below -CHA2ds2-VASc score of 5- hold anticoagulation for now due to subdural hemorrhage and initiate when it is it is okay with neurosurgery and cardiology -Continue to watch on telemetry -Check TSH and is 3.890  Hypotension -Initially became hypotensive following diltiazem bolus for treatment of new onset atrial fibrillation.  Now resolved with IV fluids. -Will need to be judicious about use of fluids given her chest x-ray now shows some cardiomegaly and vascular congestion -Hold ACE and metoprolol for now and defer to cardiology to initiate back  Acute on chronic hyponatremia -Corrected sodium of 121 from a baseline around 125 -127 -Obtain urine sodium and urine osmolality -Sodium is slowly improving is now 91 -Cardiology feels that she could also have a possibility of adrenal insufficiency  Mild hyperkalemia -Give Kayexalate and now potassium is 5.0  Hyperglycemia with history of type 2 diabetes -Low-dose sliding scale  -Hemoglobin A1c is 6.8 -CBGs ranging from 178-233  Tobacco use -Encouraged cessation but patient does not plan to quit.   -Continue with nicotine patch here  Depression -Continue Mirtazapine 30 mg sq qHS  Fibromyalgia -Continue Savella 50 mg p.o. twice daily  Iron deficiency anemia -Continue iron supplementation with Niferex 150 mg p.o. daily  Mitral stenosis  -ECHO done and showed "The mitral valve is degenerative. Severe mitral annular calcification. Trivial mitral valve regurgitation. Severe mitral stenosis. MG 12 mmHg at HR 105 bpm, MVA 0.7cm^2 by continuity equation.  Gradients increased due to  Afib with RVR."  -Cardiology feels that they do not need to use warfarin and long-term she should be on a DOAC if falls can be prevented -Follow-up with cardiology and appreciate their evaluation recommendations  COPD -She has some coarse breath sounds with some mild wheezing but could be related to the fluid and pulmonary vascular congestion -Continue with Xopenex/Atrovent 3 times daily  -Repeat chest x-ray in a.m. -We will add flutter valve and incentive spirometry  Hyperlipidemia -Continue with Pravachol 40 once daily  Suspected Dementia -We will need outpatient neurological follow-up given that the son has noticed a progressive decline  DVT prophylaxis: Enoxaparin 40 mg sq q24h Code Status: FULL CODE  Family Communication: No family present at bedside but updated Son via Telephon Disposition Plan: Pending Cardiac Clearance and improvement  Status is: Inpatient  Remains inpatient appropriate because:Ongoing diagnostic testing needed not appropriate for outpatient work up, IV treatments appropriate due to intensity of illness or inability to take PO and Inpatient level of care appropriate due to severity of illness   Dispo: The patient is from: Home              Anticipated d/c is to: Home              Anticipated d/c date is: 1-2 day  Patient currently is not medically stable to d/c.  Consultants:   Cardiology  Neurosurgery   Procedures:  ECHOCARDIOGRAM IMPRESSIONS    1. Left ventricular ejection fraction, by estimation, is 50 to 55%. The  left ventricle has low normal function. The left ventricle has no regional  wall motion abnormalities. There is moderate asymmetric left ventricular  hypertrophy of the basal-septal  segment. Left ventricular diastolic parameters are indeterminate.  2. Right ventricular systolic function is mildly reduced. The right  ventricular size is normal. There is mildly elevated pulmonary artery    systolic pressure. The estimated right ventricular systolic pressure is  35.3 mmHg.  3. The mitral valve is degenerative. Severe mitral annular calcification.  Trivial mitral valve regurgitation. Severe mitral stenosis. MG 12 mmHg at  HR 105 bpm, MVA 0.7cm^2 by continuity equation. Gradients increased due to  Afib with RVR  4. The aortic valve is tricuspid. Aortic valve regurgitation is not  visualized. Mild to moderate aortic valve sclerosis/calcification is  present, without any evidence of aortic stenosis.  5. The inferior vena cava is normal in size with greater than 50%  respiratory variability, suggesting right atrial pressure of 3 mmHg.   FINDINGS  Left Ventricle: Left ventricular ejection fraction, by estimation, is 50  to 55%. The left ventricle has low normal function. The left ventricle has  no regional wall motion abnormalities. The left ventricular internal  cavity size was small. There is  moderate asymmetric left ventricular hypertrophy of the basal-septal  segment. Left ventricular diastolic parameters are indeterminate.   Right Ventricle: The right ventricular size is normal. Right vetricular  wall thickness was not assessed. Right ventricular systolic function is  mildly reduced. There is mildly elevated pulmonary artery systolic  pressure. The tricuspid regurgitant  velocity is 2.84 m/s, and with an assumed right atrial pressure of 3 mmHg,  the estimated right ventricular systolic pressure is 35.3 mmHg.   Left Atrium: Left atrial size was normal in size.   Right Atrium: Right atrial size was normal in size.   Pericardium: There is no evidence of pericardial effusion.   Mitral Valve: The mitral valve is degenerative in appearance. Severe  mitral annular calcification. Trivial mitral valve regurgitation. Severe  mitral valve stenosis. MV peak gradient, 24.3 mmHg. The mean mitral valve  gradient is 11.0 mmHg.   Tricuspid Valve: The tricuspid valve is normal  in structure. Tricuspid  valve regurgitation is trivial.   Aortic Valve: The aortic valve is tricuspid. Aortic valve regurgitation is  not visualized. Mild to moderate aortic valve sclerosis/calcification is  present, without any evidence of aortic stenosis.   Pulmonic Valve: The pulmonic valve was not well visualized. Pulmonic valve  regurgitation is not visualized.   Aorta: The aortic root and ascending aorta are structurally normal, with  no evidence of dilitation.   Venous: The inferior vena cava is normal in size with greater than 50%  respiratory variability, suggesting right atrial pressure of 3 mmHg.   IAS/Shunts: No atrial level shunt detected by color flow Doppler.     LEFT VENTRICLE  PLAX 2D  LVIDd:     3.30 cm  LVIDs:     2.80 cm  LV PW:     1.30 cm  LV IVS:    1.40 cm  LVOT diam:   1.60 cm  LV SV:     27  LV SV Index:  17  LVOT Area:   2.01 cm     LEFT ATRIUM  Index    RIGHT ATRIUM      Index  LA diam:    4.20 cm 2.71 cm/m RA Area:   10.50 cm  LA Vol (A2C):  27.9 ml 18.01 ml/m RA Volume:  19.00 ml 12.27 ml/m  LA Vol (A4C):  43.4 ml 28.02 ml/m  LA Biplane Vol: 35.6 ml 22.98 ml/m  AORTIC VALVE  LVOT Vmax:  75.90 cm/s  LVOT Vmean: 51.500 cm/s  LVOT VTI:  0.134 m    AORTA  Ao Root diam: 2.60 cm   MITRAL VALVE        TRICUSPID VALVE  MV Area (PHT): 1.93 cm   TR Peak grad:  32.3 mmHg  MV Peak grad: 24.3 mmHg  TR Vmax:    284.00 cm/s  MV Mean grad: 11.0 mmHg  MV Vmax:    2.46 m/s   SHUNTS  MV Vmean:   159.0 cm/s  Systemic VTI: 0.13 m  MV Decel Time: 393 msec   Systemic Diam: 1.60 cm  MV E velocity: 179.33 cm/s   Antimicrobials: Anti-infectives (From admission, onward)   None     Subjective: Seen and examined at bedside and she is doing okay but wanting a cigarette.  No chest pain, lightheadedness or dizziness but still felt a little weak.  Denies any  shortness of breath.  Feels okay.  No other concerns or complaints at this time.  Objective: Vitals:   03/12/20 1128 03/12/20 1319 03/12/20 1429 03/12/20 1554  BP: 98/74 94/68  104/68  Pulse: (!) 107 (!) 115  (!) 120  Resp:    20  Temp: 97.6 F (36.4 C)   97.8 F (36.6 C)  TempSrc: Oral   Oral  SpO2: 94%  94% 92%  Weight:      Height:        Intake/Output Summary (Last 24 hours) at 03/12/2020 1622 Last data filed at 03/12/2020 1501 Gross per 24 hour  Intake 1970.98 ml  Output 700 ml  Net 1270.98 ml   Filed Weights   03/11/20 1849  Weight: 56.9 kg   Examination: Physical Exam:  Constitutional: Thin Caucasian female currently in NAD and appears calm and comfortable Eyes: Lids and conjunctivae normal, sclerae anicteric  ENMT: External Ears, Nose appear normal. Grossly normal hearing.  Has poor dentition Neck: Appears normal, supple, no cervical masses, normal ROM, no appreciable thyromegaly: No JVD Respiratory: Diminished to auscultation bilaterally with coarse breath sounds and some mild wheezing but no appreciable rhonchi.  Unlabored breathing and she is not wearing supplemental oxygen via nasal cannula Cardiovascular: Irregularly irregular and tachycardic, has a diastolic murmur.  Minimal extremity edema  Abdomen: Soft, non-tender, non-distended. Bowel sounds positive.  GU: Deferred. Musculoskeletal: No clubbing / cyanosis of digits/nails. No joint deformity upper and lower extremities.  Skin: No rashes, lesions, ulcers on limited skin evaluation. No induration; Warm and dry.  Neurologic: CN 2-12 grossly intact with no focal deficits. Romberg sign and cerebellar reflexes not assessed.  Psychiatric: Normal judgment and insight. Alert and oriented x 3. Normal mood and appropriate affect.   Data Reviewed: I have personally reviewed following labs and imaging studies  CBC: Recent Labs  Lab 03/11/20 2035 03/12/20 0052  WBC 8.3 6.9  NEUTROABS 6.5  --   HGB 15.0 13.6  HCT  43.5 40.7  MCV 93.3 94.2  PLT 308 259   Basic Metabolic Panel: Recent Labs  Lab 03/11/20 2035 03/12/20 0052 03/12/20 0404  NA 119* 122* 123*  K 5.8* 5.0 4.3  CL 87* 91* 92*  CO2 22 21* 21*  GLUCOSE 220* 198* 196*  BUN 21 18 18   CREATININE 0.61 0.55 0.50  CALCIUM 9.5 9.2 9.3   GFR: Estimated Creatinine Clearance: 45.8 mL/min (by C-G formula based on SCr of 0.5 mg/dL). Liver Function Tests: Recent Labs  Lab 03/11/20 2035 03/12/20 0404  AST 20 18  ALT 21 20  ALKPHOS 86 73  BILITOT 1.0 0.7  PROT 7.5 6.5  ALBUMIN 4.4 3.8   No results for input(s): LIPASE, AMYLASE in the last 168 hours. No results for input(s): AMMONIA in the last 168 hours. Coagulation Profile: No results for input(s): INR, PROTIME in the last 168 hours. Cardiac Enzymes: No results for input(s): CKTOTAL, CKMB, CKMBINDEX, TROPONINI in the last 168 hours. BNP (last 3 results) No results for input(s): PROBNP in the last 8760 hours. HbA1C: Recent Labs    03/12/20 0052  HGBA1C 6.8*   CBG: Recent Labs  Lab 03/11/20 2037 03/12/20 0135 03/12/20 0741 03/12/20 1111  GLUCAP 219* 193* 201* 178*   Lipid Profile: No results for input(s): CHOL, HDL, LDLCALC, TRIG, CHOLHDL, LDLDIRECT in the last 72 hours. Thyroid Function Tests: Recent Labs    03/12/20 0052  TSH 3.890   Anemia Panel: No results for input(s): VITAMINB12, FOLATE, FERRITIN, TIBC, IRON, RETICCTPCT in the last 72 hours. Sepsis Labs: No results for input(s): PROCALCITON, LATICACIDVEN in the last 168 hours.  Recent Results (from the past 240 hour(s))  SARS Coronavirus 2 by RT PCR (hospital order, [facility]) Nasopharyngeal Nasopharyngeal Swab     Status: None   Collection Time: 03/11/20  8:35 PM   Specimen: Nasopharyngeal Swab  Result Value Ref Range Status   SARS Coronavirus 2 NEGATIVE NEGATIVE Final    Comment: (NOTE) SARS-CoV-2 target nucleic acids are NOT DETECTED.  The SARS-CoV-2 RNA is generally  detectable in upper and lower respiratory specimens during the acute phase of infection. The lowest concentration of SARS-CoV-2 viral copies this assay can detect is 250 copies / mL. A negative result does not preclude SARS-CoV-2 infection and should not be used as the sole basis for treatment or other patient management decisions.  A negative result may occur with improper specimen collection / handling, submission of specimen other than nasopharyngeal swab, presence of viral mutation(s) within the areas targeted by this assay, and inadequate number of viral copies (<250 copies / mL). A negative result must be combined with clinical observations, patient history, and epidemiological information.  Fact Sheet for Patients:   BoilerBrush.com.cyhttps://www.fda.gov/media/136312/download  Fact Sheet for Healthcare Providers: https://pope.com/https://www.fda.gov/media/136313/download  This test is not yet approved or  cleared by the [place] FDA and has been authorized for detection and/or diagnosis of SARS-CoV-2 by FDA under an Emergency Use Authorization (EUA).  This EUA will remain in effect (meaning this test can be used) for the duration of the COVID-19 declaration under Section 564(b)(1) of the Act, 21 U.S.C. section 360bbb-3(b)(1), unless the authorization is terminated or revoked sooner.  Performed at [facility], [address] [place], [place] [address]      RN Pressure Injury Documentation:     Estimated body mass index is 23.7 kg/m as calculated from the following:   Height as of this encounter: 5\' 1"  (1.549 m).   Weight as of this encounter: 56.9 kg.  Malnutrition Type:      Malnutrition Characteristics:      Nutrition Interventions:    Radiology Studies: DG Chest 2 View  Result  Date: 03/11/2020 CLINICAL DATA:  Fall EXAM: CHEST - 2 VIEW COMPARISON:  None. FINDINGS: Small bilateral pleural effusions. Mild cardiomegaly with central congestion. Mild diffuse bilateral  interstitial and ground-glass opacity. Aortic atherosclerosis. No pneumothorax. IMPRESSION: Mild cardiomegaly with central vascular congestion and small pleural effusions. Mild diffuse interstitial and ground-glass opacity, probably reflecting mild pulmonary edema, less likely atypical infection. Electronically Signed   By: Jasmine Pang M.D.   On: 03/11/2020 19:49   DG Pelvis 1-2 Views  Result Date: 03/11/2020 CLINICAL DATA:  Fall EXAM: PELVIS - 1-2 VIEW COMPARISON:  None. FINDINGS: There is no evidence of pelvic fracture or diastasis. No pelvic bone lesions are seen. Vascular calcifications. IMPRESSION: Negative. Electronically Signed   By: Jasmine Pang M.D.   On: 03/11/2020 19:49   CT HEAD WO CONTRAST  Result Date: 03/12/2020 CLINICAL DATA:  Subarachnoid hemorrhage follow-up EXAM: CT HEAD WITHOUT CONTRAST TECHNIQUE: Contiguous axial images were obtained from the base of the skull through the vertex without intravenous contrast. COMPARISON:  03/11/2020 FINDINGS: Brain: Small volume subarachnoid hemorrhage is again identified along the right central sulcus. No new hemorrhage. Ventricles are stable in size. Stable findings of chronic microvascular ischemic changes. Gray-white differentiation remains preserved. Vascular: There is atherosclerotic calcification at the skull base. Skull: Calvarium is unremarkable. Sinuses/Orbits: Opacified partially imaged left frontal, maxillary, and anterior ethmoid sinuses. Obstructing ostiomeatal unit lesion such as a polyp is not excluded. No acute orbital abnormality. Other: Mastoid air cells are clear. IMPRESSION: Stable small volume sulcal subarachnoid hemorrhage. No new findings. Electronically Signed   By: Guadlupe Spanish M.D.   On: 03/12/2020 10:04   CT Head Wo Contrast  Result Date: 03/11/2020 CLINICAL DATA:  74 year old female with facial trauma. EXAM: CT HEAD WITHOUT CONTRAST CT MAXILLOFACIAL WITHOUT CONTRAST CT CERVICAL SPINE WITHOUT CONTRAST TECHNIQUE:  Multidetector CT imaging of the head, cervical spine, and maxillofacial structures were performed using the standard protocol without intravenous contrast. Multiplanar CT image reconstructions of the cervical spine and maxillofacial structures were also generated. COMPARISON:  None. FINDINGS: Evaluation of this exam is limited due to motion artifact. CT HEAD FINDINGS Brain: There is moderate age-related atrophy and chronic microvascular ischemic changes. There is a small right frontal convexity subarachnoid hemorrhage. No mass effect or midline shift. Vascular: No hyperdense vessel or unexpected calcification. Skull: Normal. Negative for fracture or focal lesion. Other: None CT MAXILLOFACIAL FINDINGS Osseous: No acute fracture. No mandibular subluxation. Orbits: The globes and retro-orbital fat are preserved. Sinuses: Chronic appearing complete opacification of the left maxillary sinus and ethmoid air cells with remodeling of the surrounding bones. No air-fluid level. The mastoid air cells are clear. Soft tissues: Negative. CT CERVICAL SPINE FINDINGS Alignment: No acute subluxation. There is reversal of normal cervical lordosis which may be positional or due to chronic spasm. Skull base and vertebrae: No acute fracture. Evaluation however is limited due to respiratory motion artifact. Soft tissues and spinal canal: No prevertebral fluid or swelling. No visible canal hematoma. Disc levels: Multilevel degenerative changes with multilevel facet arthropathy. Upper chest: Partially visualized right pleural effusion. Other: Bilateral carotid bulb calcified plaques. IMPRESSION: 1. Small right frontal convexity subarachnoid hemorrhage. 2. No acute/traumatic cervical spine pathology. 3. No acute facial bone fractures. 4. Chronic left paranasal sinus disease. These results were called by telephone at the time of interpretation on 03/11/2020 at 9:09 pm to provider [facility] , who verbally acknowledged these results.  Electronically Signed   By: Elgie Collard M.D.   On: 03/11/2020 21:08   CT  Cervical Spine Wo Contrast  Result Date: 03/11/2020 CLINICAL DATA:  74 year old female with facial trauma. EXAM: CT HEAD WITHOUT CONTRAST CT MAXILLOFACIAL WITHOUT CONTRAST CT CERVICAL SPINE WITHOUT CONTRAST TECHNIQUE: Multidetector CT imaging of the head, cervical spine, and maxillofacial structures were performed using the standard protocol without intravenous contrast. Multiplanar CT image reconstructions of the cervical spine and maxillofacial structures were also generated. COMPARISON:  None. FINDINGS: Evaluation of this exam is limited due to motion artifact. CT HEAD FINDINGS Brain: There is moderate age-related atrophy and chronic microvascular ischemic changes. There is a small right frontal convexity subarachnoid hemorrhage. No mass effect or midline shift. Vascular: No hyperdense vessel or unexpected calcification. Skull: Normal. Negative for fracture or focal lesion. Other: None CT MAXILLOFACIAL FINDINGS Osseous: No acute fracture. No mandibular subluxation. Orbits: The globes and retro-orbital fat are preserved. Sinuses: Chronic appearing complete opacification of the left maxillary sinus and ethmoid air cells with remodeling of the surrounding bones. No air-fluid level. The mastoid air cells are clear. Soft tissues: Negative. CT CERVICAL SPINE FINDINGS Alignment: No acute subluxation. There is reversal of normal cervical lordosis which may be positional or due to chronic spasm. Skull base and vertebrae: No acute fracture. Evaluation however is limited due to respiratory motion artifact. Soft tissues and spinal canal: No prevertebral fluid or swelling. No visible canal hematoma. Disc levels: Multilevel degenerative changes with multilevel facet arthropathy. Upper chest: Partially visualized right pleural effusion. Other: Bilateral carotid bulb calcified plaques. IMPRESSION: 1. Small right frontal convexity subarachnoid  hemorrhage. 2. No acute/traumatic cervical spine pathology. 3. No acute facial bone fractures. 4. Chronic left paranasal sinus disease. These results were called by telephone at the time of interpretation on 03/11/2020 at 9:09 pm to provider [facility] , who verbally acknowledged these results. Electronically Signed   By: Elgie Collard M.D.   On: 03/11/2020 21:08   CT Maxillofacial Wo Contrast  Result Date: 03/11/2020 CLINICAL DATA:  74 year old female with facial trauma. EXAM: CT HEAD WITHOUT CONTRAST CT MAXILLOFACIAL WITHOUT CONTRAST CT CERVICAL SPINE WITHOUT CONTRAST TECHNIQUE: Multidetector CT imaging of the head, cervical spine, and maxillofacial structures were performed using the standard protocol without intravenous contrast. Multiplanar CT image reconstructions of the cervical spine and maxillofacial structures were also generated. COMPARISON:  None. FINDINGS: Evaluation of this exam is limited due to motion artifact. CT HEAD FINDINGS Brain: There is moderate age-related atrophy and chronic microvascular ischemic changes. There is a small right frontal convexity subarachnoid hemorrhage. No mass effect or midline shift. Vascular: No hyperdense vessel or unexpected calcification. Skull: Normal. Negative for fracture or focal lesion. Other: None CT MAXILLOFACIAL FINDINGS Osseous: No acute fracture. No mandibular subluxation. Orbits: The globes and retro-orbital fat are preserved. Sinuses: Chronic appearing complete opacification of the left maxillary sinus and ethmoid air cells with remodeling of the surrounding bones. No air-fluid level. The mastoid air cells are clear. Soft tissues: Negative. CT CERVICAL SPINE FINDINGS Alignment: No acute subluxation. There is reversal of normal cervical lordosis which may be positional or due to chronic spasm. Skull base and vertebrae: No acute fracture. Evaluation however is limited due to respiratory motion artifact. Soft tissues and spinal canal: No  prevertebral fluid or swelling. No visible canal hematoma. Disc levels: Multilevel degenerative changes with multilevel facet arthropathy. Upper chest: Partially visualized right pleural effusion. Other: Bilateral carotid bulb calcified plaques. IMPRESSION: 1. Small right frontal convexity subarachnoid hemorrhage. 2. No acute/traumatic cervical spine pathology. 3. No acute facial bone fractures. 4. Chronic left paranasal sinus disease. These results  were called by telephone at the time of interpretation on 03/11/2020 at 9:09 pm to provider [facility] , who verbally acknowledged these results. Electronically Signed   By: Elgie Collard M.D.   On: 03/11/2020 21:08   Scheduled Meds:  [START ON 03/13/2020] digoxin  0.125 mg Oral Daily   digoxin  0.25 mg Oral Q2H   diltiazem  240 mg Oral Daily   insulin aspart  0-5 Units Subcutaneous QHS   insulin aspart  0-9 Units Subcutaneous TID WC   ipratropium  0.5 mg Nebulization TID   iron polysaccharides  150 mg Oral Daily   levalbuterol  0.63 mg Nebulization TID   loratadine  10 mg Oral Daily   Milnacipran  50 mg Oral BID   mirtazapine  30 mg Oral QHS   montelukast  10 mg Oral QHS   nicotine  21 mg Transdermal Daily   pravastatin  40 mg Oral q1800   Continuous Infusions:   LOS: 0 days   Merlene Laughter, DO [facility] PAG[facility] is on AMION  If 7PM-7AM, please contact night-coverage [email or web address]

## 2020-03-12 NOTE — Progress Notes (Signed)
  Echocardiogram 2D Echocardiogram has been performed.  Leta Jungling M 03/12/2020, 12:34 PM

## 2020-03-12 NOTE — ED Notes (Signed)
ED TO INPATIENT HANDOFF REPORT  Name/Age/Gender Dana [name] 74 y.o. female  Code Status    Code Status Orders  (From admission, onward)         Start     Ordered   03/11/20 2249  Full code  Continuous        03/11/20 2251        Code Status History    This patient has a current code status but no historical code status.   Advance Care Planning Activity      Home/SNF/Other Home  Chief Complaint Subarachnoid bleed (HCC) [I60.9]  Level of Care/Admitting Diagnosis ED Disposition    ED Disposition Condition Comment   Admit  Hospital Area: [facility] [[id number]]  Level of Care: Telemetry [5]  Admit to tele based on following criteria: Complex arrhythmia (Bradycardia/Tachycardia)  Covid Evaluation: Asymptomatic Screening Protocol (No Symptoms)  Diagnosis: Subarachnoid bleed [facility]) [[id number]]  Admitting Physician: Anselm Jungling [[id number]]  Attending Physician: Anselm Jungling [[id number]]       Medical History Past Medical History:  Diagnosis Date  . Asthma   . Depression   . Diabetes mellitus without complication (HCC)   . Emphysema of lung (HCC)   . Heart disease   . Hyperlipidemia   . Hypertension   . Irritable bowel syndrome     Allergies Allergies  Allergen Reactions  . Bactrim [Sulfamethoxazole-Trimethoprim] Hives    IV Location/Drains/Wounds Patient Lines/Drains/Airways Status    Active Line/Drains/Airways    Name Placement date Placement time Site Days   Peripheral IV 03/11/20 Left Forearm 03/11/20  2028  Forearm  1          Labs/Imaging Results for orders placed or performed during the hospital encounter of 03/11/20 (from the past 48 hour(s))  CBC with Differential     Status: None   Collection Time: 03/11/20  8:35 PM  Result Value Ref Range   WBC 8.3 4.0 - 10.5 K/uL   RBC 4.66 3.87 - 5.11 MIL/uL   Hemoglobin 15.0 12.0 - 15.0 g/dL   HCT 00.7 36 - 46 %   MCV 93.3 80.0 - 100.0 fL   MCH 32.2 26.0 - 34.0 pg   MCHC 34.5  30.0 - 36.0 g/dL   RDW 62.2 63.3 - 35.4 %   Platelets 308 150 - 400 K/uL   nRBC 0.0 0.0 - 0.2 %   Neutrophils Relative % 79 %   Neutro Abs 6.5 1.7 - 7.7 K/uL   Lymphocytes Relative 13 %   Lymphs Abs 1.1 0.7 - 4.0 K/uL   Monocytes Relative 7 %   Monocytes Absolute 0.6 0 - 1 K/uL   Eosinophils Relative 1 %   Eosinophils Absolute 0.0 0 - 0 K/uL   Basophils Relative 0 %   Basophils Absolute 0.0 0 - 0 K/uL   Immature Granulocytes 0 %   Abs Immature Granulocytes 0.02 0.00 - 0.07 K/uL    Comment: Performed at [facility], [address], [place], [place] [address]  Comprehensive metabolic panel     Status: Abnormal   Collection Time: 03/11/20  8:35 PM  Result Value Ref Range   Sodium 119 (LL) 135 - 145 mmol/L    Comment: CRITICAL RESULT CALLED TO, READ BACK BY AND VERIFIED WITH: BOBBY BROOKS @ 2108 ON 03/11/20 C VARNER    Potassium 5.8 (H) 3.5 - 5.1 mmol/L   Chloride 87 (L) 98 - 111 mmol/L   CO2 22 22 - 32 mmol/L  Glucose, Bld 220 (H) 70 - 99 mg/dL    Comment: Glucose reference range applies only to samples taken after fasting for at least 8 hours.   BUN 21 8 - 23 mg/dL   Creatinine, Ser 1.61 0.44 - 1.00 mg/dL   Calcium 9.5 8.9 - 09.6 mg/dL   Total Protein 7.5 6.5 - 8.1 g/dL   Albumin 4.4 3.5 - 5.0 g/dL   AST 20 15 - 41 U/L   ALT 21 0 - 44 U/L   Alkaline Phosphatase 86 38 - 126 U/L   Total Bilirubin 1.0 0.3 - 1.2 mg/dL   GFR calc non Af Amer >60 >60 mL/min   GFR calc Af Amer >60 >60 mL/min   Anion gap 10 5 - 15    Comment: Performed at [facility], [address] [place], [place] [address]  SARS Coronavirus 2 by RT PCR (hospital order, performed in [facility] hospital lab) Nasopharyngeal Nasopharyngeal Swab     Status: None   Collection Time: 03/11/20  8:35 PM   Specimen: Nasopharyngeal Swab  Result Value Ref Range   SARS Coronavirus 2 NEGATIVE NEGATIVE    Comment: (NOTE) SARS-CoV-2 target nucleic acids are NOT DETECTED.  The  SARS-CoV-2 RNA is generally detectable in upper and lower respiratory specimens during the acute phase of infection. The lowest concentration of SARS-CoV-2 viral copies this assay can detect is 250 copies / mL. A negative result does not preclude SARS-CoV-2 infection and should not be used as the sole basis for treatment or other patient management decisions.  A negative result may occur with improper specimen collection / handling, submission of specimen other than nasopharyngeal swab, presence of viral mutation(s) within the areas targeted by this assay, and inadequate number of viral copies (<250 copies / mL). A negative result must be combined with clinical observations, patient history, and epidemiological information.  Fact Sheet for Patients:   BoilerBrush.com.cy  Fact Sheet for Healthcare Providers: https://pope.com/  This test is not yet approved or  cleared by the [place] FDA and has been authorized for detection and/or diagnosis of SARS-CoV-2 by FDA under an Emergency Use Authorization (EUA).  This EUA will remain in effect (meaning this test can be used) for the duration of the COVID-19 declaration under Section 564(b)(1) of the Act, 21 U.S.C. section 360bbb-3(b)(1), unless the authorization is terminated or revoked sooner.  Performed at [facility], [address], [place], [place] [address]   CBG monitoring, ED     Status: Abnormal   Collection Time: 03/11/20  8:37 PM  Result Value Ref Range   Glucose-Capillary 219 (H) 70 - 99 mg/dL    Comment: Glucose reference range applies only to samples taken after fasting for at least 8 hours.   DG Chest 2 View  Result Date: 03/11/2020 CLINICAL DATA:  Fall EXAM: CHEST - 2 VIEW COMPARISON:  None. FINDINGS: Small bilateral pleural effusions. Mild cardiomegaly with central congestion. Mild diffuse bilateral interstitial and ground-glass opacity. Aortic  atherosclerosis. No pneumothorax. IMPRESSION: Mild cardiomegaly with central vascular congestion and small pleural effusions. Mild diffuse interstitial and ground-glass opacity, probably reflecting mild pulmonary edema, less likely atypical infection. Electronically Signed   By: Jasmine Pang M.D.   On: 03/11/2020 19:49   DG Pelvis 1-2 Views  Result Date: 03/11/2020 CLINICAL DATA:  Fall EXAM: PELVIS - 1-2 VIEW COMPARISON:  None. FINDINGS: There is no evidence of pelvic fracture or diastasis. No pelvic bone lesions are seen. Vascular calcifications. IMPRESSION: Negative.  Electronically Signed   By: Jasmine PangKim  Fujinaga M.D.   On: 03/11/2020 19:49   CT Head Wo Contrast  Result Date: 03/11/2020 CLINICAL DATA:  74 year old female with facial trauma. EXAM: CT HEAD WITHOUT CONTRAST CT MAXILLOFACIAL WITHOUT CONTRAST CT CERVICAL SPINE WITHOUT CONTRAST TECHNIQUE: Multidetector CT imaging of the head, cervical spine, and maxillofacial structures were performed using the standard protocol without intravenous contrast. Multiplanar CT image reconstructions of the cervical spine and maxillofacial structures were also generated. COMPARISON:  None. FINDINGS: Evaluation of this exam is limited due to motion artifact. CT HEAD FINDINGS Brain: There is moderate age-related atrophy and chronic microvascular ischemic changes. There is a small right frontal convexity subarachnoid hemorrhage. No mass effect or midline shift. Vascular: No hyperdense vessel or unexpected calcification. Skull: Normal. Negative for fracture or focal lesion. Other: None CT MAXILLOFACIAL FINDINGS Osseous: No acute fracture. No mandibular subluxation. Orbits: The globes and retro-orbital fat are preserved. Sinuses: Chronic appearing complete opacification of the left maxillary sinus and ethmoid air cells with remodeling of the surrounding bones. No air-fluid level. The mastoid air cells are clear. Soft tissues: Negative. CT CERVICAL SPINE FINDINGS Alignment: No  acute subluxation. There is reversal of normal cervical lordosis which may be positional or due to chronic spasm. Skull base and vertebrae: No acute fracture. Evaluation however is limited due to respiratory motion artifact. Soft tissues and spinal canal: No prevertebral fluid or swelling. No visible canal hematoma. Disc levels: Multilevel degenerative changes with multilevel facet arthropathy. Upper chest: Partially visualized right pleural effusion. Other: Bilateral carotid bulb calcified plaques. IMPRESSION: 1. Small right frontal convexity subarachnoid hemorrhage. 2. No acute/traumatic cervical spine pathology. 3. No acute facial bone fractures. 4. Chronic left paranasal sinus disease. These results were called by telephone at the time of interpretation on 03/11/2020 at 9:09 pm to [facility] , who verbally acknowledged these results. Electronically Signed   By: Elgie CollardArash  Radparvar M.D.   On: 03/11/2020 21:08   CT Cervical Spine Wo Contrast  Result Date: 03/11/2020 CLINICAL DATA:  74 year old female with facial trauma. EXAM: CT HEAD WITHOUT CONTRAST CT MAXILLOFACIAL WITHOUT CONTRAST CT CERVICAL SPINE WITHOUT CONTRAST TECHNIQUE: Multidetector CT imaging of the head, cervical spine, and maxillofacial structures were performed using the standard protocol without intravenous contrast. Multiplanar CT image reconstructions of the cervical spine and maxillofacial structures were also generated. COMPARISON:  None. FINDINGS: Evaluation of this exam is limited due to motion artifact. CT HEAD FINDINGS Brain: There is moderate age-related atrophy and chronic microvascular ischemic changes. There is a small right frontal convexity subarachnoid hemorrhage. No mass effect or midline shift. Vascular: No hyperdense vessel or unexpected calcification. Skull: Normal. Negative for fracture or focal lesion. Other: None CT MAXILLOFACIAL FINDINGS Osseous: No acute fracture. No mandibular subluxation. Orbits: The globes and  retro-orbital fat are preserved. Sinuses: Chronic appearing complete opacification of the left maxillary sinus and ethmoid air cells with remodeling of the surrounding bones. No air-fluid level. The mastoid air cells are clear. Soft tissues: Negative. CT CERVICAL SPINE FINDINGS Alignment: No acute subluxation. There is reversal of normal cervical lordosis which may be positional or due to chronic spasm. Skull base and vertebrae: No acute fracture. Evaluation however is limited due to respiratory motion artifact. Soft tissues and spinal canal: No prevertebral fluid or swelling. No visible canal hematoma. Disc levels: Multilevel degenerative changes with multilevel facet arthropathy. Upper chest: Partially visualized right pleural effusion. Other: Bilateral carotid bulb calcified plaques. IMPRESSION: 1. Small right frontal convexity subarachnoid hemorrhage. 2. No acute/traumatic  cervical spine pathology. 3. No acute facial bone fractures. 4. Chronic left paranasal sinus disease. These results were called by telephone at the time of interpretation on 03/11/2020 at 9:09 pm to provider [facility] , who verbally acknowledged these results. Electronically Signed   By: Elgie Collard M.D.   On: 03/11/2020 21:08   CT Maxillofacial Wo Contrast  Result Date: 03/11/2020 CLINICAL DATA:  74 year old female with facial trauma. EXAM: CT HEAD WITHOUT CONTRAST CT MAXILLOFACIAL WITHOUT CONTRAST CT CERVICAL SPINE WITHOUT CONTRAST TECHNIQUE: Multidetector CT imaging of the head, cervical spine, and maxillofacial structures were performed using the standard protocol without intravenous contrast. Multiplanar CT image reconstructions of the cervical spine and maxillofacial structures were also generated. COMPARISON:  None. FINDINGS: Evaluation of this exam is limited due to motion artifact. CT HEAD FINDINGS Brain: There is moderate age-related atrophy and chronic microvascular ischemic changes. There is a small right frontal  convexity subarachnoid hemorrhage. No mass effect or midline shift. Vascular: No hyperdense vessel or unexpected calcification. Skull: Normal. Negative for fracture or focal lesion. Other: None CT MAXILLOFACIAL FINDINGS Osseous: No acute fracture. No mandibular subluxation. Orbits: The globes and retro-orbital fat are preserved. Sinuses: Chronic appearing complete opacification of the left maxillary sinus and ethmoid air cells with remodeling of the surrounding bones. No air-fluid level. The mastoid air cells are clear. Soft tissues: Negative. CT CERVICAL SPINE FINDINGS Alignment: No acute subluxation. There is reversal of normal cervical lordosis which may be positional or due to chronic spasm. Skull base and vertebrae: No acute fracture. Evaluation however is limited due to respiratory motion artifact. Soft tissues and spinal canal: No prevertebral fluid or swelling. No visible canal hematoma. Disc levels: Multilevel degenerative changes with multilevel facet arthropathy. Upper chest: Partially visualized right pleural effusion. Other: Bilateral carotid bulb calcified plaques. IMPRESSION: 1. Small right frontal convexity subarachnoid hemorrhage. 2. No acute/traumatic cervical spine pathology. 3. No acute facial bone fractures. 4. Chronic left paranasal sinus disease. These results were called by telephone at the time of interpretation on 03/11/2020 at 9:09 pm to provider [facility] , who verbally acknowledged these results. Electronically Signed   By: Elgie Collard M.D.   On: 03/11/2020 21:08    Pending Labs Unresulted Labs (From admission, onward) Comment          Start     Ordered   03/12/20 0500  TSH  Tomorrow morning,   R        03/11/20 2251   03/12/20 0500  Basic metabolic panel  Tomorrow morning,   R        03/11/20 2251   03/12/20 0500  CBC  Tomorrow morning,   R        03/11/20 2251   03/12/20 0009  Hemoglobin A1c  Once,   STAT       Comments: To assess prior glycemic control     03/12/20 0008   03/11/20 2250  Sodium, urine, random  Once,   STAT        03/11/20 2251   03/11/20 2250  Osmolality, urine  Once,   STAT        03/11/20 2251   03/11/20 2250  Urine rapid drug screen (hosp performed)  ONCE - STAT,   STAT        03/11/20 2251   03/11/20 1929  Urinalysis, Routine w reflex microscopic  (ED ALOC)  Once,   STAT        03/11/20 1928  Vitals/Pain Today's Vitals   03/11/20 2200 03/11/20 2210 03/11/20 2220 03/11/20 2241  BP:  99/66 101/71 110/82  Pulse: 72 64 68 75  Resp: 18 18 18 14   Temp:      SpO2: 95% 95% 95% 95%  Weight:      Height:      PainSc:        Isolation Precautions No active isolations  Medications Medications  nicotine (NICODERM CQ - dosed in mg/24 hours) patch 21 mg (21 mg Transdermal Patch Applied 03/11/20 2247)  sodium polystyrene (KAYEXALATE) 15 GM/60ML suspension 15 g (has no administration in time range)  insulin aspart (novoLOG) injection 0-9 Units (has no administration in time range)  insulin aspart (novoLOG) injection 0-5 Units (has no administration in time range)  sodium chloride 0.9 % bolus 1,000 mL (0 mLs Intravenous Stopped 03/11/20 2200)  diltiazem (CARDIZEM) 1 mg/mL load via infusion 20 mg (20 mg Intravenous Bolus from Bag 03/11/20 2054)    Mobility walks with person assist

## 2020-03-12 NOTE — Progress Notes (Signed)
Called in regards to this patient head CT last night that revealed a small right tSAH. She apparently fell last Friday. She was not on any anticoagulants at the time. Since she is now 6 days out from the initial injury I think that she is out of her observation window from an acute standpoint. Would recommend follow up head CT today and no anticoagulants. Please call if change in follow up head CT.

## 2020-03-13 ENCOUNTER — Inpatient Hospital Stay (HOSPITAL_COMMUNITY): Payer: Medicare Other | Admitting: Anesthesiology

## 2020-03-13 ENCOUNTER — Inpatient Hospital Stay (HOSPITAL_COMMUNITY): Payer: Medicare Other

## 2020-03-13 ENCOUNTER — Encounter (HOSPITAL_COMMUNITY): Payer: Self-pay | Admitting: Internal Medicine

## 2020-03-13 DIAGNOSIS — J810 Acute pulmonary edema: Secondary | ICD-10-CM

## 2020-03-13 DIAGNOSIS — J9601 Acute respiratory failure with hypoxia: Secondary | ICD-10-CM

## 2020-03-13 DIAGNOSIS — I609 Nontraumatic subarachnoid hemorrhage, unspecified: Secondary | ICD-10-CM | POA: Diagnosis not present

## 2020-03-13 DIAGNOSIS — J9602 Acute respiratory failure with hypercapnia: Secondary | ICD-10-CM

## 2020-03-13 LAB — CBC WITH DIFFERENTIAL/PLATELET
Abs Immature Granulocytes: 0.07 10*3/uL (ref 0.00–0.07)
Basophils Absolute: 0 10*3/uL (ref 0.0–0.1)
Basophils Relative: 0 %
Eosinophils Absolute: 0 10*3/uL (ref 0.0–0.5)
Eosinophils Relative: 0 %
HCT: 43.2 % (ref 36.0–46.0)
Hemoglobin: 14.7 g/dL (ref 12.0–15.0)
Immature Granulocytes: 1 %
Lymphocytes Relative: 8 %
Lymphs Abs: 1.1 10*3/uL (ref 0.7–4.0)
MCH: 32.9 pg (ref 26.0–34.0)
MCHC: 34 g/dL (ref 30.0–36.0)
MCV: 96.6 fL (ref 80.0–100.0)
Monocytes Absolute: 0.6 10*3/uL (ref 0.1–1.0)
Monocytes Relative: 4 %
Neutro Abs: 12.2 10*3/uL — ABNORMAL HIGH (ref 1.7–7.7)
Neutrophils Relative %: 87 %
Platelets: 310 10*3/uL (ref 150–400)
RBC: 4.47 MIL/uL (ref 3.87–5.11)
RDW: 13.4 % (ref 11.5–15.5)
WBC: 14 10*3/uL — ABNORMAL HIGH (ref 4.0–10.5)
nRBC: 0 % (ref 0.0–0.2)

## 2020-03-13 LAB — COMPREHENSIVE METABOLIC PANEL
ALT: 17 U/L (ref 0–44)
AST: 52 U/L — ABNORMAL HIGH (ref 15–41)
Albumin: 4.1 g/dL (ref 3.5–5.0)
Alkaline Phosphatase: 90 U/L (ref 38–126)
Anion gap: 13 (ref 5–15)
BUN: 13 mg/dL (ref 8–23)
CO2: 20 mmol/L — ABNORMAL LOW (ref 22–32)
Calcium: 9.1 mg/dL (ref 8.9–10.3)
Chloride: 88 mmol/L — ABNORMAL LOW (ref 98–111)
Creatinine, Ser: 0.79 mg/dL (ref 0.44–1.00)
GFR calc Af Amer: >60 mL/min (ref >60)
GFR calc non Af Amer: >60 mL/min (ref >60)
Glucose, Bld: 337 mg/dL — ABNORMAL HIGH (ref 70–99)
Potassium: 4.8 mmol/L (ref 3.5–5.1)
Sodium: 121 mmol/L — ABNORMAL LOW (ref 135–145)
Total Bilirubin: 2.2 mg/dL — ABNORMAL HIGH (ref 0.3–1.2)
Total Protein: 7 g/dL (ref 6.5–8.1)

## 2020-03-13 LAB — URINALYSIS, ROUTINE W REFLEX MICROSCOPIC
Bilirubin Urine: NEGATIVE
Glucose, UA: NEGATIVE mg/dL
Ketones, ur: NEGATIVE mg/dL
Nitrite: NEGATIVE
Protein, ur: NEGATIVE mg/dL
Specific Gravity, Urine: 1.009 (ref 1.005–1.030)
pH: 5 (ref 5.0–8.0)

## 2020-03-13 LAB — BLOOD GAS, ARTERIAL
Acid-base deficit: 5 mmol/L — ABNORMAL HIGH (ref 0.0–2.0)
Bicarbonate: 20.8 mmol/L (ref 20.0–28.0)
Drawn by: 331471
FIO2: 50
MECHVT: 380 mL
O2 Saturation: 95 %
PEEP: 5 cmH2O
Patient temperature: 98.6
RATE: 20 resp/min
pCO2 arterial: 43.6 mmHg (ref 32.0–48.0)
pH, Arterial: 7.301 — ABNORMAL LOW (ref 7.350–7.450)
pO2, Arterial: 87.5 mmHg (ref 83.0–108.0)

## 2020-03-13 LAB — POCT I-STAT 7, (LYTES, BLD GAS, ICA,H+H)
Acid-base deficit: 5 mmol/L — ABNORMAL HIGH (ref 0.0–2.0)
Bicarbonate: 23.9 mmol/L (ref 20.0–28.0)
Calcium, Ion: 1.21 mmol/L (ref 1.15–1.40)
HCT: 45 % (ref 36.0–46.0)
Hemoglobin: 15.3 g/dL — ABNORMAL HIGH (ref 12.0–15.0)
O2 Saturation: 98 %
Patient temperature: 97.8
Potassium: 4.1 mmol/L (ref 3.5–5.1)
Sodium: 123 mmol/L — ABNORMAL LOW (ref 135–145)
TCO2: 26 mmol/L (ref 22–32)
pCO2 arterial: 57.4 mmHg — ABNORMAL HIGH (ref 32.0–48.0)
pH, Arterial: 7.226 — ABNORMAL LOW (ref 7.350–7.450)
pO2, Arterial: 119 mmHg — ABNORMAL HIGH (ref 83.0–108.0)

## 2020-03-13 LAB — GLUCOSE, CAPILLARY
Glucose-Capillary: 241 mg/dL — ABNORMAL HIGH (ref 70–99)
Glucose-Capillary: 271 mg/dL — ABNORMAL HIGH (ref 70–99)
Glucose-Capillary: 272 mg/dL — ABNORMAL HIGH (ref 70–99)
Glucose-Capillary: 289 mg/dL — ABNORMAL HIGH (ref 70–99)
Glucose-Capillary: 390 mg/dL — ABNORMAL HIGH (ref 70–99)

## 2020-03-13 LAB — MRSA PCR SCREENING: MRSA by PCR: NEGATIVE

## 2020-03-13 LAB — MAGNESIUM: Magnesium: 1.8 mg/dL (ref 1.7–2.4)

## 2020-03-13 LAB — PHOSPHORUS: Phosphorus: 4.2 mg/dL (ref 2.5–4.6)

## 2020-03-13 MED ORDER — CHLORHEXIDINE GLUCONATE 0.12% ORAL RINSE (MEDLINE KIT)
15.0000 mL | Freq: Two times a day (BID) | OROMUCOSAL | Status: DC
  Administered 2020-03-13 – 2020-03-14 (×4): 15 mL via OROMUCOSAL

## 2020-03-13 MED ORDER — GUAIFENESIN ER 600 MG PO TB12
600.0000 mg | ORAL_TABLET | Freq: Two times a day (BID) | ORAL | Status: DC
  Administered 2020-03-13 – 2020-03-16 (×2): 600 mg via ORAL
  Filled 2020-03-13 (×4): qty 1

## 2020-03-13 MED ORDER — FUROSEMIDE 10 MG/ML IJ SOLN
40.0000 mg | Freq: Once | INTRAMUSCULAR | Status: AC
  Administered 2020-03-13: 40 mg via INTRAVENOUS

## 2020-03-13 MED ORDER — DIGOXIN 0.0625 MG HALF TABLET
0.0625 mg | ORAL_TABLET | Freq: Every day | ORAL | Status: DC
  Administered 2020-03-16: 0.0625 mg via ORAL
  Filled 2020-03-13 (×2): qty 1
  Filled 2020-03-13: qty 0.5

## 2020-03-13 MED ORDER — AMIODARONE HCL IN DEXTROSE 360-4.14 MG/200ML-% IV SOLN
30.0000 mg/h | INTRAVENOUS | Status: DC
  Administered 2020-03-13 – 2020-03-17 (×9): 30 mg/h via INTRAVENOUS
  Filled 2020-03-13 (×8): qty 200

## 2020-03-13 MED ORDER — LORAZEPAM 2 MG/ML IJ SOLN
INTRAMUSCULAR | Status: AC
  Administered 2020-03-13: 1 mg
  Filled 2020-03-13: qty 1

## 2020-03-13 MED ORDER — AMIODARONE LOAD VIA INFUSION
150.0000 mg | Freq: Once | INTRAVENOUS | Status: AC
  Administered 2020-03-13: 150 mg via INTRAVENOUS
  Filled 2020-03-13: qty 83.34

## 2020-03-13 MED ORDER — FENTANYL CITRATE (PF) 100 MCG/2ML IJ SOLN
25.0000 ug | INTRAMUSCULAR | Status: DC | PRN
  Administered 2020-03-13 (×2): 50 ug via INTRAVENOUS
  Administered 2020-03-13 – 2020-03-14 (×2): 25 ug via INTRAVENOUS
  Filled 2020-03-13 (×2): qty 2

## 2020-03-13 MED ORDER — SUCCINYLCHOLINE CHLORIDE 20 MG/ML IJ SOLN
INTRAMUSCULAR | Status: DC | PRN
  Administered 2020-03-13: 100 mg via INTRAVENOUS

## 2020-03-13 MED ORDER — LACTATED RINGERS IV BOLUS
250.0000 mL | Freq: Once | INTRAVENOUS | Status: AC
  Administered 2020-03-13: 250 mL via INTRAVENOUS

## 2020-03-13 MED ORDER — FUROSEMIDE 10 MG/ML IJ SOLN
INTRAMUSCULAR | Status: AC
  Filled 2020-03-13: qty 4

## 2020-03-13 MED ORDER — CHLORHEXIDINE GLUCONATE CLOTH 2 % EX PADS
6.0000 | MEDICATED_PAD | Freq: Every day | CUTANEOUS | Status: DC
  Administered 2020-03-14 – 2020-03-17 (×2): 6 via TOPICAL

## 2020-03-13 MED ORDER — IOHEXOL 350 MG/ML SOLN
100.0000 mL | Freq: Once | INTRAVENOUS | Status: AC | PRN
  Administered 2020-03-13: 75 mL via INTRAVENOUS

## 2020-03-13 MED ORDER — MIDAZOLAM HCL 2 MG/2ML IJ SOLN
1.0000 mg | INTRAMUSCULAR | Status: DC | PRN
  Administered 2020-03-13: 1 mg via INTRAVENOUS
  Filled 2020-03-13: qty 2

## 2020-03-13 MED ORDER — INSULIN DETEMIR 100 UNIT/ML ~~LOC~~ SOLN
10.0000 [IU] | Freq: Every day | SUBCUTANEOUS | Status: DC
  Administered 2020-03-13 – 2020-03-14 (×2): 10 [IU] via SUBCUTANEOUS
  Filled 2020-03-13 (×3): qty 0.1

## 2020-03-13 MED ORDER — METHYLPREDNISOLONE SODIUM SUCC 125 MG IJ SOLR
40.0000 mg | Freq: Three times a day (TID) | INTRAMUSCULAR | Status: DC
  Administered 2020-03-13 – 2020-03-17 (×13): 40 mg via INTRAVENOUS
  Filled 2020-03-13 (×14): qty 2

## 2020-03-13 MED ORDER — MIDAZOLAM HCL 2 MG/2ML IJ SOLN
1.0000 mg | INTRAMUSCULAR | Status: DC | PRN
  Administered 2020-03-13 – 2020-03-15 (×3): 1 mg via INTRAVENOUS
  Filled 2020-03-13 (×3): qty 2

## 2020-03-13 MED ORDER — GUAIFENESIN ER 600 MG PO TB12
600.0000 mg | ORAL_TABLET | Freq: Two times a day (BID) | ORAL | Status: DC
  Filled 2020-03-13: qty 1

## 2020-03-13 MED ORDER — AMIODARONE HCL IN DEXTROSE 360-4.14 MG/200ML-% IV SOLN
60.0000 mg/h | INTRAVENOUS | Status: AC
  Administered 2020-03-13 (×2): 60 mg/h via INTRAVENOUS
  Filled 2020-03-13: qty 200

## 2020-03-13 MED ORDER — FLUMAZENIL 0.5 MG/5ML IV SOLN
INTRAVENOUS | Status: AC
  Filled 2020-03-13: qty 5

## 2020-03-13 MED ORDER — FUROSEMIDE 10 MG/ML IJ SOLN
20.0000 mg | Freq: Once | INTRAMUSCULAR | Status: AC
  Administered 2020-03-13: 20 mg via INTRAVENOUS
  Filled 2020-03-13: qty 2

## 2020-03-13 MED ORDER — SODIUM CHLORIDE (PF) 0.9 % IJ SOLN
INTRAMUSCULAR | Status: AC
  Filled 2020-03-13: qty 50

## 2020-03-13 MED ORDER — DILTIAZEM HCL 25 MG/5ML IV SOLN
5.0000 mg | Freq: Once | INTRAVENOUS | Status: AC
  Administered 2020-03-13: 5 mg via INTRAVENOUS
  Filled 2020-03-13: qty 5

## 2020-03-13 MED ORDER — INSULIN ASPART 100 UNIT/ML ~~LOC~~ SOLN
0.0000 [IU] | SUBCUTANEOUS | Status: DC
  Administered 2020-03-13 (×2): 5 [IU] via SUBCUTANEOUS
  Administered 2020-03-14: 1 [IU] via SUBCUTANEOUS
  Administered 2020-03-14: 3 [IU] via SUBCUTANEOUS
  Administered 2020-03-14: 2 [IU] via SUBCUTANEOUS
  Administered 2020-03-14: 3 [IU] via SUBCUTANEOUS
  Administered 2020-03-14 – 2020-03-15 (×3): 2 [IU] via SUBCUTANEOUS
  Administered 2020-03-15: 1 [IU] via SUBCUTANEOUS
  Administered 2020-03-16: 7 [IU] via SUBCUTANEOUS
  Administered 2020-03-16 (×2): 2 [IU] via SUBCUTANEOUS
  Administered 2020-03-16: 7 [IU] via SUBCUTANEOUS
  Administered 2020-03-16 – 2020-03-17 (×2): 3 [IU] via SUBCUTANEOUS
  Administered 2020-03-17: 5 [IU] via SUBCUTANEOUS

## 2020-03-13 MED ORDER — IPRATROPIUM-ALBUTEROL 0.5-2.5 (3) MG/3ML IN SOLN
3.0000 mL | Freq: Once | RESPIRATORY_TRACT | Status: DC
  Filled 2020-03-13: qty 3

## 2020-03-13 MED ORDER — DILTIAZEM 12 MG/ML ORAL SUSPENSION
60.0000 mg | Freq: Four times a day (QID) | ORAL | Status: DC
  Administered 2020-03-13 – 2020-03-14 (×5): 60 mg via ORAL
  Filled 2020-03-13 (×9): qty 6

## 2020-03-13 MED ORDER — PHENYLEPHRINE HCL (PRESSORS) 10 MG/ML IV SOLN
INTRAVENOUS | Status: DC | PRN
  Administered 2020-03-13: 80 ug via INTRAVENOUS
  Administered 2020-03-13: 160 ug via INTRAVENOUS

## 2020-03-13 MED ORDER — LORAZEPAM 2 MG/ML IJ SOLN
1.0000 mg | Freq: Once | INTRAMUSCULAR | Status: AC
  Administered 2020-03-13: 1 mg via INTRAVENOUS

## 2020-03-13 MED ORDER — GUAIFENESIN ER 600 MG PO TB12: 600.0000 mg | ORAL_TABLET | Freq: Two times a day (BID) | ORAL | Status: DC

## 2020-03-13 MED ORDER — DOCUSATE SODIUM 50 MG/5ML PO LIQD
100.0000 mg | Freq: Two times a day (BID) | ORAL | Status: DC
  Administered 2020-03-13 (×2): 100 mg via ORAL
  Filled 2020-03-13 (×4): qty 10

## 2020-03-13 MED ORDER — FENTANYL CITRATE (PF) 100 MCG/2ML IJ SOLN
25.0000 ug | INTRAMUSCULAR | Status: AC | PRN
  Administered 2020-03-13 (×3): 25 ug via INTRAVENOUS

## 2020-03-13 MED ORDER — DILTIAZEM HCL 25 MG/5ML IV SOLN
10.0000 mg | Freq: Once | INTRAVENOUS | Status: AC
  Administered 2020-03-13: 10 mg via INTRAVENOUS
  Filled 2020-03-13: qty 5

## 2020-03-13 MED ORDER — MORPHINE SULFATE (PF) 2 MG/ML IV SOLN
2.0000 mg | Freq: Once | INTRAVENOUS | Status: DC
  Filled 2020-03-13: qty 1

## 2020-03-13 MED ORDER — PHENYLEPHRINE HCL-NACL 10-0.9 MG/250ML-% IV SOLN
INTRAVENOUS | Status: AC
  Filled 2020-03-13: qty 250

## 2020-03-13 MED ORDER — IPRATROPIUM-ALBUTEROL 0.5-2.5 (3) MG/3ML IN SOLN
3.0000 mL | Freq: Four times a day (QID) | RESPIRATORY_TRACT | Status: DC
  Administered 2020-03-13 – 2020-03-14 (×5): 3 mL via RESPIRATORY_TRACT
  Filled 2020-03-13 (×5): qty 3

## 2020-03-13 MED ORDER — ORAL CARE MOUTH RINSE
15.0000 mL | OROMUCOSAL | Status: DC
  Administered 2020-03-13 – 2020-03-14 (×11): 15 mL via OROMUCOSAL

## 2020-03-13 MED ORDER — POLYETHYLENE GLYCOL 3350 17 G PO PACK
17.0000 g | PACK | Freq: Every day | ORAL | Status: DC
  Administered 2020-03-13 – 2020-03-16 (×2): 17 g via ORAL
  Filled 2020-03-13 (×3): qty 1

## 2020-03-13 MED ORDER — ETOMIDATE 2 MG/ML IV SOLN
INTRAVENOUS | Status: DC | PRN
  Administered 2020-03-13: 12 mg via INTRAVENOUS

## 2020-03-13 MED ORDER — FENTANYL 2500MCG IN NS 250ML (10MCG/ML) PREMIX INFUSION
0.0000 ug/h | INTRAVENOUS | Status: DC
  Administered 2020-03-13: 25 ug/h via INTRAVENOUS
  Administered 2020-03-14: 125 ug/h via INTRAVENOUS
  Filled 2020-03-13 (×2): qty 250

## 2020-03-13 MED ORDER — METHYLPREDNISOLONE SODIUM SUCC 125 MG IJ SOLR
125.0000 mg | Freq: Once | INTRAMUSCULAR | Status: AC
  Administered 2020-03-13: 125 mg via INTRAVENOUS
  Filled 2020-03-13: qty 2

## 2020-03-13 MED ORDER — DILTIAZEM LOAD VIA INFUSION: 5.0000 mg | Freq: Once | INTRAVENOUS | Status: DC

## 2020-03-13 NOTE — Progress Notes (Signed)
RN was notified (by CT  Radiology Tech) that the patient will be transported to  CT angio very shortly. The patient was informed and will keep monitoring the patient.

## 2020-03-13 NOTE — Consult Note (Signed)
NAME:  Dana [name], MRN:  [id number], DOB:  03/30/1946, LOS: 1 ADMISSION DATE:  03/11/2020, CONSULTATION DATE:  7/30 REFERRING MD:  Dr. Marland McalpineSheikh, CHIEF COMPLAINT:  Hypoxia   Brief History   74 year old female admitted after fall and found to have SAH and new onset AF. 7/30 AM she developed respiratory distress requiring intubation prompting PCCM consultation.   History of present illness   Patient is encephalopathic and/or intubated. Therefore history has been obtained from chart review.   74 year old female with PMH as below, which is significant for DM, COPD, HTN, HLD. She has a recent history of neurological decline with concern for dementia. She had been suffering multiple falls at home of late as well. She lives by herself. She fell approximately 7/24 with impact to head and a facial laceration. She did not present at that time, but on 7/28 she was observed falling onto couch and was brought to [facility] ED. Upon arrival to the ED she was found to be in atrial fibrillation, which is a new diagnosis. She was started on diltiazem, which was stopped due to hypotension. CT of the head demonstrated small frontal SAH. Lab eval significant for Na 121, down from baseline of about 127. She was admitted to the hospitalists service with cardiology consultation and was started on diltiazem. In the morning hours of 7/30, she developed respiratory distress prompting CTA of the chest, which was concerning for volume overload. Upon arriving from CT, she became profoundly hypoxic with O2 sats in the 70s. Her distress continued despite 15L NRB and she was intubated by anesthesia. PCCM consulted.   Past Medical History   has a past medical history of Asthma, Depression, Diabetes mellitus without complication (HCC), Emphysema of lung (HCC), Heart disease, Hyperlipidemia, Hypertension, and Irritable bowel syndrome.  Significant Hospital Events   7/28 admit 7/30 respiratory decompensation resulting in  intubation.   Consults:  Cardiology PCCM  Procedures:  ETT 7/30 >  Significant Diagnostic Tests:  CT head 7/28 > Small right frontal convexity subarachnoid hemorrhage. CT head 7/29 > Stable small volume sulcal subarachnoid hemorrhage. Echo 7/29 > LVEF 50-55%, no regional wall motion abnormalities. Severe mitral stenosis CTA chest 7/30 > No demonstrable pulmonary embolus. Bilateral pleural effusions with diffuse interstitial pulmonary edema. Areas of patchy airspace opacity likely represent patchy alveolar edema.  Micro Data:    Antimicrobials:    Interim history/subjective:    Objective   Blood pressure (!) 157/61, pulse (!) 123, temperature (!) 97.5 F (36.4 C), temperature source Axillary, resp. rate 18, height 5\' 1"  (1.549 m), weight 56.9 kg, SpO2 97 %.    FiO2 (%):  [50 %] 50 %   Intake/Output Summary (Last 24 hours) at 03/13/2020 0636 Last data filed at 03/12/2020 1841 Gross per 24 hour  Intake 970.98 ml  Output 700 ml  Net 270.98 ml   Filed Weights   03/11/20 1849  Weight: 56.9 kg    Examination: General: Elderly woman, sedated, no distress, intubated HENT: Mild pallor, no icterus, no JVD Lungs: Scattered crackles, no rhonchi Cardiovascular: S1-S2 regular, tacky, atrial fibrillation on monitor Abdomen: Soft, nontender Extremities: No edema, no deformity Neuro: Sedated, RASS -2, pupils 3 mm PERRTL GU: Clear urine  Chest x-ray personally reviewed which shows ET tube in position and pulmonary edema pattern ABG shows acute respiratory acidosis. Labs show hyperglycemia, hyponatremia , mild leukocytosis  Resolved Hospital Problem list     Assessment & Plan:   Acute hypoxemic/hypercarbic respiratory failure secondary  to pulmonary edema and bilateral pleural effusions.  Underlying COPD - STAT intubation - Full vent support - Diurese - PRN sedation with fentanyl for RASS goal 0 to -1  Subarachnoid hemorrhage: small frontal bleed, likely traumatic -  Nothing to do surgically per NSGY.  -Repeat head CT does not show any change which is reassuring  COPD +/- acute exacerbation -IV Solu-Medrol 40 every 8 - Nebs, using Xopenex for fear of causing tachyarrhythmias  Hyponatremia: acute on chronic -   Atrial fibrillation with RVR, new onset.  - Telemetry - digoxin -Cardizem 60 every 6 with hold parameters - Holding anticoagulation in setting of SAH  Uncontrolled hyperglycemia, due to steroids -Add Levemir 10 units SSI resistant scale  Global: patient has been declining per family over the past few months and there are concerns for dementia.  Reviewed living well brought in by son and discussed with him. No CPR no cardioversion issued.  She would be amenable to full medical care and short-term mechanical ventilation   Best practice:  Diet: NPO Pain/Anxiety/Delirium protocol (if indicated): Per Protocol VAP protocol (if indicated): Per protocol DVT prophylaxis: NA GI prophylaxis: PPI Glucose control: SSI Mobility: BR Code Status: FULL Family Communication: Son updated at bedside Disposition: ICU  Labs   CBC: Recent Labs  Lab 03/11/20 2035 03/12/20 0052 03/13/20 0537 03/13/20 0558  WBC 8.3 6.9 14.0*  --   NEUTROABS 6.5  --  12.2*  --   HGB 15.0 13.6 14.7 15.3*  HCT 43.5 40.7 43.2 45.0  MCV 93.3 94.2 96.6  --   PLT 308 259 310  --     Basic Metabolic Panel: Recent Labs  Lab 03/11/20 2035 03/12/20 0052 03/12/20 0404 03/13/20 0537 03/13/20 0558  NA 119* 122* 123* 121* 123*  K 5.8* 5.0 4.3 4.8 4.1  CL 87* 91* 92* 88*  --   CO2 22 21* 21* 20*  --   GLUCOSE 220* 198* 196* 337*  --   BUN 21 18 18 13   --   CREATININE 0.61 0.55 0.50 0.79  --   CALCIUM 9.5 9.2 9.3 9.1  --   MG  --   --   --  1.8  --   PHOS  --   --   --  4.2  --    GFR: Estimated Creatinine Clearance: 45.8 mL/min (by C-G formula based on SCr of 0.79 mg/dL). Recent Labs  Lab 03/11/20 2035 03/12/20 0052 03/13/20 0537  WBC 8.3 6.9 14.0*     Liver Function Tests: Recent Labs  Lab 03/11/20 2035 03/12/20 0404 03/13/20 0537  AST 20 18 52*  ALT 21 20 17   ALKPHOS 86 73 90  BILITOT 1.0 0.7 2.2*  PROT 7.5 6.5 7.0  ALBUMIN 4.4 3.8 4.1   No results for input(s): LIPASE, AMYLASE in the last 168 hours. No results for input(s): AMMONIA in the last 168 hours.  ABG    Component Value Date/Time   PHART 7.226 (L) 03/13/2020 0558   PCO2ART 57.4 (H) 03/13/2020 0558   PO2ART 119 (H) 03/13/2020 0558   HCO3 23.9 03/13/2020 0558   TCO2 26 03/13/2020 0558   ACIDBASEDEF 5.0 (H) 03/13/2020 0558   O2SAT 98.0 03/13/2020 0558     Coagulation Profile: No results for input(s): INR, PROTIME in the last 168 hours.  Cardiac Enzymes: No results for input(s): CKTOTAL, CKMB, CKMBINDEX, TROPONINI in the last 168 hours.  HbA1C: Hgb A1c MFr Bld  Date/Time Value Ref Range Status  03/12/2020 12:52 AM 6.8 (  H) 4.8 - 5.6 % Final    Comment:    (NOTE) Pre diabetes:          5.7%-6.4%  Diabetes:              >6.4%  Glycemic control for   <7.0% adults with diabetes   12/20/2019 12:41 PM 6.6 (H) 4.6 - 6.5 % Final    Comment:    Glycemic Control Guidelines for People with Diabetes:Non Diabetic:  <6%Goal of Therapy: <7%Additional Action Suggested:  >8%     CBG: Recent Labs  Lab 03/12/20 0135 03/12/20 0741 03/12/20 1111 03/12/20 1702 03/12/20 2021  GLUCAP 193* 201* 178* 233* 232*   Review of Systems:    Unable to obtain  Past Medical History  She,  has a past medical history of Asthma, Depression, Diabetes mellitus without complication (HCC), Emphysema of lung (HCC), Heart disease, Hyperlipidemia, Hypertension, and Irritable bowel syndrome.   Surgical History    Past Surgical History:  Procedure Laterality Date  . ABDOMINAL HYSTERECTOMY    . bladder tack    . CHOLECYSTECTOMY       Social History   reports that she has been smoking cigarettes. She has never used smokeless tobacco. She reports previous alcohol use. She  reports previous drug use.   Family History   Her family history includes Cancer in her brother; Depression in her son; Heart attack in her brother, father, sister, and son; Heart disease in her brother, brother, father, mother, sister, sister, and son; Hyperlipidemia in her father and son; Hypertension in her father, mother, sister, and son; Kidney disease in her son; Learning disabilities in her son; Mental illness in her brother, brother, and son.   Allergies Allergies  Allergen Reactions  . Bactrim [Sulfamethoxazole-Trimethoprim] Hives     Home Medications  Prior to Admission medications   Medication Sig Start Date End Date Taking? Authorizing Provider  aspirin 81 MG tablet Take 81 mg by mouth daily.   Yes [provider]  calcium citrate-vitamin D 500-400 MG-UNIT chewable tablet Chew 1 tablet by mouth daily.   Yes [provider]  cetirizine (ZYRTEC) 10 MG tablet Take 1 tablet (10 mg total) by mouth daily. 07/02/18  Yes [place], Betty G, MD  Cranberry 500 MG CAPS Take 500 mg by mouth daily.   Yes [provider]  desloratadine (CLARINEX) 5 MG tablet TAKE 1 TABLET DAILY Patient taking differently: Take 5 mg by mouth daily.  12/30/19  Yes [place], Betty G, MD  iron polysaccharides (NIFEREX) 150 MG capsule Take 150 mg by mouth daily.   Yes [provider]  JANUVIA 50 MG tablet TAKE 1 TABLET DAILY Patient taking differently: Take 50 mg by mouth daily.  11/13/19  Yes [place], Betty G, MD  LIVALO 2 MG TABS TAKE 1 TABLET DAILY Patient taking differently: Take 2 mg by mouth daily.  12/31/19  Yes [place], Betty G, MD  losartan (COZAAR) 25 MG tablet TAKE 1 TABLET DAILY Patient taking differently: Take 25 mg by mouth daily.  12/10/19  Yes [place], Betty G, MD  metFORMIN (GLUCOPHAGE) 1000 MG tablet TAKE 1 TABLET TWICE A DAY WITH MEALS Patient taking differently: Take 1,000 mg by mouth 2 (two) times daily with a meal.  09/27/19  Yes [place], Betty G, MD  metoprolol  succinate (TOPROL-XL) 50 MG 24 hr tablet TAKE 1 TABLET DAILY WITH OR IMMEDIATELY FOLLOWING A MEAL Patient taking differently: Take 50 mg by mouth daily.  12/23/19  Yes [place], Betty G, MD  mirtazapine (REMERON) 30 MG tablet TAKE 1 TABLET AT BEDTIME Patient taking differently: Take 30 mg by mouth at bedtime.  06/28/19  Yes [place], Betty G, MD  montelukast (SINGULAIR) 10 MG tablet TAKE 1 TABLET AT BEDTIME Patient taking differently: Take 10 mg by mouth at bedtime.  12/30/19  Yes [place], Betty G, MD  SAVELLA 50 MG TABS tablet TAKE 1 TABLET EVERY 12 HOURS Patient taking differently: Take 50 mg by mouth 2 (two) times daily.  06/28/19  Yes [place], Betty G, MD  vitamin C (ASCORBIC ACID) 500 MG tablet Take 500 mg by mouth daily.   Yes [provider]  vitamin E (VITAMIN E) 400 UNIT capsule Take 400 Units by mouth daily.   Yes [provider]     Critical care time: 50 minutes     Cyril Mourning MD. FCCP. [facility]  If no response to pager , please call [phone]   03/13/2020

## 2020-03-13 NOTE — Progress Notes (Signed)
   03/13/20 0321  Assess: MEWS Score  ECG Heart Rate (!) 121  Resp (!) 28  Level of Consciousness Alert  Assess: MEWS Score  MEWS Temp 0  MEWS Systolic 0  MEWS Pulse 2  MEWS RR 2  MEWS LOC 0  MEWS Score 4  MEWS Score Color Red  Assess: if the MEWS score is Yellow or Red  Were vital signs taken at a resting state? Yes  Focused Assessment Change from prior assessment (see assessment flowsheet)  Early Detection of Sepsis Score *See Row Information* Medium  MEWS guidelines implemented *See Row Information* Yes  Treat  MEWS Interventions Administered scheduled meds/treatments;Escalated (See documentation below);Other (Comment) (Notified Charge)  Pain Scale 0-10  Pain Score 0  Take Vital Signs  Increase Vital Sign Frequency  Red: Q 1hr X 4 then Q 4hr X 4, if remains red, continue Q 4hrs  Escalate  MEWS: Escalate Red: discuss with charge nurse/RN and provider, consider discussing with RRT  Notify: Charge Nurse/RN  Name of Charge Nurse/RN Notified [place]  Date Charge Nurse/RN Notified 03/13/20  Time Charge Nurse/RN Notified 0359  Notify: Provider  Provider Name/Title Mansy  Date Provider Notified 03/13/20  Time Provider Notified [phone]  Notification Type Page  Notification Reason Change in status  Response See new orders  Date of Provider Response 03/13/20  Time of Provider Response 0250  Notify: Rapid Response  Name of Rapid Response RN Notified Alexis  Date Rapid Response Notified 03/13/20  Time Rapid Response Notified 0401

## 2020-03-13 NOTE — Progress Notes (Signed)
History per chart and report from nursing staff: Patient admitted for subarachnoid hemorrhage, hyponatremia, new onset A. fib with RVR, pulmonary vascular congestion and echo showing severe mitral stenosis.  Floor coverage MD was paged tonight as patient was in respiratory distress.  CT angiogram was done which was negative for PE but showed pulmonary edema.  Patient was given Lasix.  She has also been receiving Xopenex/Atrovent for COPD.  She had increased work of breathing and became hypoxic to the 70s and was placed on nonrebreather.  Patient was agitated and received Ativan.     Soon after receiving Ativan, she became unresponsive and I was paged by nursing staff to come in and evaluate the patient.  She was seen and examined at bedside.  Noted to have increased work of breathing.  Responsive only to sternal rubs.  Diffuse rhonchi noted upon auscultation of the lungs.  She was placed on BiPAP.    ABG with evidence of acidemia and hypercarbia.  pH 7.22 and PCO2 58.  Critical care physician consulted and recommended rapid intubation. Patient is being intubated by anesthesiology.  Critical care team on board.

## 2020-03-13 NOTE — Progress Notes (Signed)
The events overnight were reviewed and patient is now intubated and sedated in ICU secondary to acute respiratory distress.  Her CT of the chest showed bilateral pleural effusions with diffuse interstitial pulmonary edema.  She is started on duo nebs, Solu-Medrol and Lasix.  I discussed the case with the critical care nurse practitioner and the patient is intubated for airway protection 2/2 to her Acute Hypoxemic/Hypercarbic Respiratory Failure and now is on critical care service and appreciate their management and help.  TRH will sign off the case until she is medically stable to be transferred back to our service.  Cardiology still following her A. fib with RVR.

## 2020-03-13 NOTE — Progress Notes (Signed)
°  Amiodarone Drug - Drug Interaction Consult Note  Recommendations: Decrease digoxin to 0.0625 mg  Amiodarone is metabolized by the cytochrome P450 system and therefore has the potential to cause many drug interactions. Amiodarone has an average plasma half-life of 50 days (range 20 to 100 days).   There is potential for drug interactions to occur several weeks or months after stopping treatment and the onset of drug interactions may be slow after initiating amiodarone.   []  Statins: Increased risk of myopathy. Simvastatin- restrict dose to 20mg  daily. Other statins: counsel patients to report any muscle pain or weakness immediately.  []  Anticoagulants: Amiodarone can increase anticoagulant effect. Consider warfarin dose reduction. Patients should be monitored closely and the dose of anticoagulant altered accordingly, remembering that amiodarone levels take several weeks to stabilize.  []  Antiepileptics: Amiodarone can increase plasma concentration of phenytoin, the dose should be reduced. Note that small changes in phenytoin dose can result in large changes in levels. Monitor patient and counsel on signs of toxicity.  []  Beta blockers: increased risk of bradycardia, AV block and myocardial depression. Sotalol - avoid concomitant use.  [x]   Calcium channel blockers (diltiazem and verapamil): increased risk of bradycardia, AV block and myocardial depression.  []   Cyclosporine: Amiodarone increases levels of cyclosporine. Reduced dose of cyclosporine is recommended.  [x]  Digoxin dose should be halved when amiodarone is started.  []  Diuretics: increased risk of cardiotoxicity if hypokalemia occurs.  []  Oral hypoglycemic agents (glyburide, glipizide, glimepiride): increased risk of hypoglycemia. Patient's glucose levels should be monitored closely when initiating amiodarone therapy.   []  Drugs that prolong the QT interval:  Torsades de pointes risk may be increased with concurrent use - avoid  if possible.  Monitor QTc, also keep magnesium/potassium WNL if concurrent therapy can't be avoided.  Antibiotics: e.g. fluoroquinolones, erythromycin.  Antiarrhythmics: e.g. quinidine, procainamide, disopyramide, sotalol.  Antipsychotics: e.g. phenothiazines, haloperidol.   Lithium, tricyclic antidepressants, and methadone. Thank You,  D  03/13/2020 11:09 AM

## 2020-03-13 NOTE — Significant Event (Signed)
Rapid Response Event Note  Overview: Time Called: 0428 Arrival Time: 0432 Event Type: Respiratory, Cardiac  Initial Focused Assessment: Nurse called concerned over patient's shortness of breath after a trip to the bathroom. States that patient has been on room air, but after bathroom trip patient's oxygen needs increased to 4L. MD was paged and a CT angio was ordered showing congestion. A total of 60 mg of lasix have been ordered and administered with 400 cc of clear, yellow urine returned so far. When patient returned from CT scan, sats dropped as low as 78, and a NRB was applied at 15L. Sats immediately rose to 100% on NRB. Patient became increasingly agitated, attempting to climb out of bed. Heart rate continued to rise with agitation    Interventions:  Plan of Care (if not transferred):  Event Summary: Name of Physician Notified: mansy at 0440    at    Outcome: Transferred (Comment)  Event End Time: 0547  Larence Penning

## 2020-03-13 NOTE — Anesthesia Procedure Notes (Signed)
Procedure Name: Intubation Date/Time: 03/13/2020 6:41 AM Performed by: Lind Covert, CRNA Pre-anesthesia Checklist: Patient identified, Emergency Drugs available, Suction available, Patient being monitored and Timeout performed Patient Re-evaluated:Patient Re-evaluated prior to induction Oxygen Delivery Method: Circle system utilized Preoxygenation: Pre-oxygenation with 100% oxygen Induction Type: IV induction Laryngoscope Size: Mac, Glidescope and 3 Grade View: Grade I Tube type: Oral Laser Tube: Cuffed inflated with minimal occlusive pressure - saline Tube size: 7.0 mm Number of attempts: 1 Airway Equipment and Method: Stylet Placement Confirmation: ETT inserted through vocal cords under direct vision,  positive ETCO2 and breath sounds checked- equal and bilateral Secured at: 22 cm Tube secured with: Tape Dental Injury: Teeth and Oropharynx as per pre-operative assessment

## 2020-03-13 NOTE — Progress Notes (Signed)
Initial Nutrition Assessment  INTERVENTION:   Tube feeding recommendations: -Vital AF 1.2 @ 45 ml/hr via OGT -Provides 1296 kcals, 81g protein and 875 ml H2O.  NUTRITION DIAGNOSIS:   Inadequate oral intake related to inability to eat as evidenced by NPO status.  GOAL:   Patient will meet greater than or equal to 90% of their needs  MONITOR:   Vent status, Labs, Weight trends, I & O's  REASON FOR ASSESSMENT:   Ventilator    ASSESSMENT:   Patient admitted for subarachnoid hemorrhage, hyponatremia, new onset A. fib with RVR, pulmonary vascular congestion and echo showing severe mitral stenosis.  7/29: admitted for AMS/recurrent falls 7/30: intubated early AM  Patient is currently intubated on ventilator support MV: 7.4 L/min Temp (24hrs), Avg:97.8 F (36.6 C), Min:97.4 F (36.3 C), Max:98.7 F (37.1 C)  Prior to respiratory distress and intubation, pt was on a heart healthy diet 7/29. Consumed 75% of dinner last night.  Family uncertain about PO intakes recently.  Per weight records, pt's weight has remained stable.  Medications: Colace, Niferex, Remeron, Miralax Labs reviewed: CBGs: 289-390  NUTRITION - FOCUSED PHYSICAL EXAM:  Deferred.  Diet Order:   Diet Order            Diet NPO time specified  Diet effective now                 EDUCATION NEEDS:   No education needs have been identified at this time  Skin:  Skin Assessment: Reviewed RN Assessment  Last BM:  PTA  Height:   Ht Readings from Last 1 Encounters:  03/13/20 5\' 1"  (1.549 m)    Weight:   Wt Readings from Last 1 Encounters:  03/11/20 56.9 kg    BMI:  Body mass index is 23.7 kg/m.  Estimated Nutritional Needs:   Kcal:  1179  Protein:  80-90g  Fluid:  1.4L/day  03/13/20, MS, RD, LDN Inpatient Clinical Dietitian Contact information available via Amion

## 2020-03-13 NOTE — Progress Notes (Signed)
Transported patient to STAT CT Angio. Upon returning from CT, patient's work of breathing increased and O2 sats dropped.Called RRT and RT to come see patient. RR RN met me in room and we placed NRB on patient to keep O2 sats above 93%. RR RN notifed Dr. Mansy.  ° °Order for patient to be transferred to ICU and transported patient to Room 1236 in ICU.  °

## 2020-03-13 NOTE — Consult Note (Addendum)
Progress Note  Patient Name: Dana [name] Date of Encounter: 03/13/2020  Primary Cardiologist: New to [facility]   Subjective   Pt intubated and sedated. Son at bedside  Inpatient Medications    Scheduled Meds:  chlorhexidine gluconate (MEDLINE KIT)  15 mL Mouth Rinse BID   Chlorhexidine Gluconate Cloth  6 each Topical Daily   digoxin  0.125 mg Oral Daily   diltiazem  240 mg Oral Daily   docusate  100 mg Oral BID   flumazenil       furosemide       guaiFENesin  600 mg Oral BID   insulin aspart  0-5 Units Subcutaneous QHS   insulin aspart  0-9 Units Subcutaneous TID WC   ipratropium-albuterol  3 mL Nebulization QID   ipratropium-albuterol  3 mL Nebulization Once   iron polysaccharides  150 mg Oral Daily   loratadine  10 mg Oral Daily   mouth rinse  15 mL Mouth Rinse 10 times per day   methylPREDNISolone (SOLU-MEDROL) injection  40 mg Intravenous Q8H   Milnacipran  50 mg Oral BID   mirtazapine  30 mg Oral QHS   montelukast  10 mg Oral QHS    morphine injection  2 mg Intravenous Once   nicotine  21 mg Transdermal Daily   polyethylene glycol  17 g Oral Daily   pravastatin  40 mg Oral q1800   sodium chloride (PF)       Continuous Infusions:  phenylephrine     PRN Meds: fentaNYL (SUBLIMAZE) injection, fentaNYL (SUBLIMAZE) injection, levalbuterol, midazolam, midazolam   Vital Signs    Vitals:   03/13/20 0321 03/13/20 0420 03/13/20 0608 03/13/20 0647  BP:  (!) 156/84 (!) 157/61 (!) 120/62  Pulse:  95 (!) 123 (!) 107  Resp: (!) 28 (!) 28 18 (!) 24  Temp:  (!) 97.5 F (36.4 C)    TempSrc:  Axillary    SpO2:  (!) 85% 97% 99%  Weight:      Height:   _0  (1.549 m) _1  (1.549 m)    Intake/Output Summary (Last 24 hours) at 03/13/2020 0747 Last data filed at 03/12/2020 1841 Gross per 24 hour  Intake 970.98 ml  Output 700 ml  Net 270.98 ml   Filed Weights   03/11/20 1849  Weight: 56.9 kg   Physical Exam   General: Ill  appearing, NAD Neck: Negative for carotid bruits. No JVD Lungs: Diminished in bilateral lower lobes. ETT in place  Cardiovascular: Irregularly irregular with S1 S2. + murmur Abdomen: Soft, non-tender, non-distended. No obvious abdominal masses. Extremities: No edema. Radial pulses 2+ bilaterally Neuro: Intubated and sedated. No focal deficits. No facial asymmetry. MAE spontaneously. Psych: Intubated and sedated.    Labs    Chemistry Recent Labs  Lab 03/11/20 2035 03/11/20 2035 03/12/20 0052 03/12/20 0052 03/12/20 0404 03/13/20 0537 03/13/20 0558  NA 119*   < > 122*   < > 123* 121* 123*  K 5.8*   < > 5.0   < > 4.3 4.8 4.1  CL 87*   < > 91*  --  92* 88*  --   CO2 22   < > 21*  --  21* 20*  --   GLUCOSE 220*   < > 198*  --  196* 337*  --   BUN 21   < > 18  --  18 13  --   CREATININE 0.61   < > 0.55  --  0.50 0.79  --   CALCIUM 9.5   < > 9.2  --  9.3 9.1  --   PROT 7.5  --   --   --  6.5 7.0  --   ALBUMIN 4.4  --   --   --  3.8 4.1  --   AST 20  --   --   --  18 52*  --   ALT 21  --   --   --  20 17  --   ALKPHOS 86  --   --   --  73 90  --   BILITOT 1.0  --   --   --  0.7 2.2*  --   GFRNONAA >60   < > >60  --  >60 >60  --   GFRAA >60   < > >60  --  >60 >60  --   ANIONGAP 10   < > 10  --  10 13  --    < > = values in this interval not displayed.     Hematology Recent Labs  Lab 03/11/20 2035 03/11/20 2035 03/12/20 0052 03/13/20 0537 03/13/20 0558  WBC 8.3  --  6.9 14.0*  --   RBC 4.66  --  4.32 4.47  --   HGB 15.0   < > 13.6 14.7 15.3*  HCT 43.5   < > 40.7 43.2 45.0  MCV 93.3  --  94.2 96.6  --   MCH 32.2  --  31.5 32.9  --   MCHC 34.5  --  33.4 34.0  --   RDW 13.4  --  13.3 13.4  --   PLT 308  --  259 310  --    < > = values in this interval not displayed.    Cardiac EnzymesNo results for input(s): TROPONINI in the last 168 hours. No results for input(s): TROPIPOC in the last 168 hours.   BNPNo results for input(s): BNP, PROBNP in the last 168 hours.    DDimer No results for input(s): DDIMER in the last 168 hours.   Radiology    DG Chest 2 View  Result Date: 03/11/2020 CLINICAL DATA:  Fall EXAM: CHEST - 2 VIEW COMPARISON:  None. FINDINGS: Small bilateral pleural effusions. Mild cardiomegaly with central congestion. Mild diffuse bilateral interstitial and ground-glass opacity. Aortic atherosclerosis. No pneumothorax. IMPRESSION: Mild cardiomegaly with central vascular congestion and small pleural effusions. Mild diffuse interstitial and ground-glass opacity, probably reflecting mild pulmonary edema, less likely atypical infection. Electronically Signed   By: Donavan Foil M.D.   On: 03/11/2020 19:49   DG Pelvis 1-2 Views  Result Date: 03/11/2020 CLINICAL DATA:  Fall EXAM: PELVIS - 1-2 VIEW COMPARISON:  None. FINDINGS: There is no evidence of pelvic fracture or diastasis. No pelvic bone lesions are seen. Vascular calcifications. IMPRESSION: Negative. Electronically Signed   By: Donavan Foil M.D.   On: 03/11/2020 19:49   CT HEAD WO CONTRAST  Result Date: 03/12/2020 CLINICAL DATA:  Subarachnoid hemorrhage follow-up EXAM: CT HEAD WITHOUT CONTRAST TECHNIQUE: Contiguous axial images were obtained from the base of the skull through the vertex without intravenous contrast. COMPARISON:  03/11/2020 FINDINGS: Brain: Small volume subarachnoid hemorrhage is again identified along the right central sulcus. No new hemorrhage. Ventricles are stable in size. Stable findings of chronic microvascular ischemic changes. Gray-white differentiation remains preserved. Vascular: There is atherosclerotic calcification at the skull base. Skull: Calvarium is unremarkable. Sinuses/Orbits: Opacified partially imaged left frontal, maxillary, and anterior  ethmoid sinuses. Obstructing ostiomeatal unit lesion such as a polyp is not excluded. No acute orbital abnormality. Other: Mastoid air cells are clear. IMPRESSION: Stable small volume sulcal subarachnoid hemorrhage. No new  findings. Electronically Signed   By: Macy Mis M.D.   On: 03/12/2020 10:04   CT Head Wo Contrast  Result Date: 03/11/2020 CLINICAL DATA:  74 year old female with facial trauma. EXAM: CT HEAD WITHOUT CONTRAST CT MAXILLOFACIAL WITHOUT CONTRAST CT CERVICAL SPINE WITHOUT CONTRAST TECHNIQUE: Multidetector CT imaging of the head, cervical spine, and maxillofacial structures were performed using the standard protocol without intravenous contrast. Multiplanar CT image reconstructions of the cervical spine and maxillofacial structures were also generated. COMPARISON:  None. FINDINGS: Evaluation of this exam is limited due to motion artifact. CT HEAD FINDINGS Brain: There is moderate age-related atrophy and chronic microvascular ischemic changes. There is a small right frontal convexity subarachnoid hemorrhage. No mass effect or midline shift. Vascular: No hyperdense vessel or unexpected calcification. Skull: Normal. Negative for fracture or focal lesion. Other: None CT MAXILLOFACIAL FINDINGS Osseous: No acute fracture. No mandibular subluxation. Orbits: The globes and retro-orbital fat are preserved. Sinuses: Chronic appearing complete opacification of the left maxillary sinus and ethmoid air cells with remodeling of the surrounding bones. No air-fluid level. The mastoid air cells are clear. Soft tissues: Negative. CT CERVICAL SPINE FINDINGS Alignment: No acute subluxation. There is reversal of normal cervical lordosis which may be positional or due to chronic spasm. Skull base and vertebrae: No acute fracture. Evaluation however is limited due to respiratory motion artifact. Soft tissues and spinal canal: No prevertebral fluid or swelling. No visible canal hematoma. Disc levels: Multilevel degenerative changes with multilevel facet arthropathy. Upper chest: Partially visualized right pleural effusion. Other: Bilateral carotid bulb calcified plaques. IMPRESSION: 1. Small right frontal convexity subarachnoid  hemorrhage. 2. No acute/traumatic cervical spine pathology. 3. No acute facial bone fractures. 4. Chronic left paranasal sinus disease. These results were called by telephone at the time of interpretation on 03/11/2020 at 9:09 pm to provider [facility] , who verbally acknowledged these results. Electronically Signed   By: Anner Crete M.D.   On: 03/11/2020 21:08   CT ANGIO CHEST PE W OR WO CONTRAST  Result Date: 03/13/2020 CLINICAL DATA:  Shortness of breath EXAM: CT ANGIOGRAPHY CHEST WITH CONTRAST TECHNIQUE: Multidetector CT imaging of the chest was performed using the standard protocol during bolus administration of intravenous contrast. Multiplanar CT image reconstructions and MIPs were obtained to evaluate the vascular anatomy. CONTRAST:  44m OMNIPAQUE IOHEXOL 350 MG/ML SOLN COMPARISON:  Chest radiograph March 11, 2020 FINDINGS: Cardiovascular: There is no demonstrable pulmonary embolus. There is no thoracic aortic aneurysm. There is no obvious dissection. Note that the contrast bolus within the aorta is not sufficient for assessment for possess 10 Scholl aortic dissection. There are foci of calcification in proximal visualized great vessels. There is aortic atherosclerosis. There are multiple foci of coronary artery calcification. A small amount of pericardial fluid is within physiologic range. There is no pericardial thickening. Heart is mildly enlarged. Mediastinum/Nodes: Visualized thyroid appears unremarkable. There are subcentimeter mediastinal lymph nodes. There is no appreciable thoracic adenopathy by size criteria. No esophageal lesions are appreciable. Lungs/Pleura: There are moderate free-flowing pleural effusions bilaterally. There is diffuse interstitial pulmonary edema areas of patchy airspace opacity bilaterally likely represent alveolar edema. Upper Abdomen: There is reflux of contrast into the inferior vena cava and hepatic veins. There is abdominal aortic and splenic artery  calcification. Visualized upper abdominal structures otherwise appear normal.  Musculoskeletal: No blastic or lytic bone lesions. No chest wall lesions evident. Review of the MIP images confirms the above findings. IMPRESSION: 1. No demonstrable pulmonary embolus. No thoracic aortic aneurysm. No dissection seen; note that the contrast bolus in the aorta is not sufficient for potential dissection assessment. There is aortic atherosclerosis as well as great vessel and coronary artery calcification. 2. Bilateral pleural effusions with diffuse interstitial pulmonary edema. Areas of patchy airspace opacity likely represent patchy alveolar edema. The overall appearance is felt to be most indicative of a degree of congestive heart failure. 3. Reflux of contrast into the inferior vena cava and hepatic veins is likely indicative of increase in right heart pressure. Aortic Atherosclerosis (ICD10-I70.0). Electronically Signed   By: Lowella Grip III M.D.   On: 03/13/2020 04:56   CT Cervical Spine Wo Contrast  Result Date: 03/11/2020 CLINICAL DATA:  74 year old female with facial trauma. EXAM: CT HEAD WITHOUT CONTRAST CT MAXILLOFACIAL WITHOUT CONTRAST CT CERVICAL SPINE WITHOUT CONTRAST TECHNIQUE: Multidetector CT imaging of the head, cervical spine, and maxillofacial structures were performed using the standard protocol without intravenous contrast. Multiplanar CT image reconstructions of the cervical spine and maxillofacial structures were also generated. COMPARISON:  None. FINDINGS: Evaluation of this exam is limited due to motion artifact. CT HEAD FINDINGS Brain: There is moderate age-related atrophy and chronic microvascular ischemic changes. There is a small right frontal convexity subarachnoid hemorrhage. No mass effect or midline shift. Vascular: No hyperdense vessel or unexpected calcification. Skull: Normal. Negative for fracture or focal lesion. Other: None CT MAXILLOFACIAL FINDINGS Osseous: No acute fracture.  No mandibular subluxation. Orbits: The globes and retro-orbital fat are preserved. Sinuses: Chronic appearing complete opacification of the left maxillary sinus and ethmoid air cells with remodeling of the surrounding bones. No air-fluid level. The mastoid air cells are clear. Soft tissues: Negative. CT CERVICAL SPINE FINDINGS Alignment: No acute subluxation. There is reversal of normal cervical lordosis which may be positional or due to chronic spasm. Skull base and vertebrae: No acute fracture. Evaluation however is limited due to respiratory motion artifact. Soft tissues and spinal canal: No prevertebral fluid or swelling. No visible canal hematoma. Disc levels: Multilevel degenerative changes with multilevel facet arthropathy. Upper chest: Partially visualized right pleural effusion. Other: Bilateral carotid bulb calcified plaques. IMPRESSION: 1. Small right frontal convexity subarachnoid hemorrhage. 2. No acute/traumatic cervical spine pathology. 3. No acute facial bone fractures. 4. Chronic left paranasal sinus disease. These results were called by telephone at the time of interpretation on 03/11/2020 at 9:09 pm to provider [facility] , who verbally acknowledged these results. Electronically Signed   By: Anner Crete M.D.   On: 03/11/2020 21:08   Portable Chest x-ray  Result Date: 03/13/2020 CLINICAL DATA:  Endotracheal tube placement.  OG tube placement. EXAM: PORTABLE CHEST 1 VIEW COMPARISON:  03/11/2020. FINDINGS: Endotracheal tube noted with its tip 2 cm above the carina. NG tube noted with tip below left hemidiaphragm. Heart size normal. Progressive bilateral pulmonary infiltrates/edema. No pleural effusion or pneumothorax. IMPRESSION: 1.  Endotracheal tube and NG tube noted good anatomic position. 2. Diffuse bilateral pulmonary infiltrates/edema, progressed from prior exam. Electronically Signed   By: Burchard   On: 03/13/2020 07:36   DG Abd Portable 1V  Result Date:  03/13/2020 CLINICAL DATA:  Endotracheal and OG tube placement. EXAM: PORTABLE ABDOMEN - 1 VIEW COMPARISON:  Chest x-ray 03/11/2020. FINDINGS: NG tube noted with its tip over the stomach in good anatomic position. Mild small and large bowel distention  cannot be excluded. Bilateral pulmonary infiltrates. IMPRESSION: 1. NG tube noted with its tip over the stomach in good anatomic position. Mild small large bowel distention cannot be excluded. 2.  Bilateral pulmonary infiltrates. Electronically Signed   By: Marcello Moores  Register   On: 03/13/2020 07:37   ECHOCARDIOGRAM COMPLETE  Result Date: 03/12/2020    ECHOCARDIOGRAM REPORT   Patient Name:   Dana [name] Date of Exam: 03/12/2020 Medical Rec #:  [id number]            Height:       61.0 in Accession #:    [id number]           Weight:       125.4 lb Date of Birth:  1945-11-02             BSA:          1.549 m Patient Age:    8 years             BP:           94/68 mmHg Patient Gender: F                    HR:           115 bpm. Exam Location:  Inpatient Procedure: 2D Echo Indications:     Atrial Fibrillation 427.31 / I48.91  History:         Patient has no prior history of Echocardiogram examinations.                  COPD; Risk Factors:Diabetes, Current Smoker, Hypertension and                  Dyslipidemia. Chronic kidney disease, anemia. Emphysema.  Sonographer:     Darlina Sicilian RDCS Referring Phys:  [id number] [facility] Diagnosing Phys: Oswaldo Milian MD IMPRESSIONS  1. Left ventricular ejection fraction, by estimation, is 50 to 55%. The left ventricle has low normal function. The left ventricle has no regional wall motion abnormalities. There is moderate asymmetric left ventricular hypertrophy of the basal-septal segment. Left ventricular diastolic parameters are indeterminate.  2. Right ventricular systolic function is mildly reduced. The right ventricular size is normal. There is mildly elevated pulmonary artery systolic pressure. The estimated right  ventricular systolic pressure is 24.4 mmHg.  3. The mitral valve is degenerative. Severe mitral annular calcification. Trivial mitral valve regurgitation. Severe mitral stenosis. MG 12 mmHg at HR 105 bpm, MVA 0.7cm^2 by continuity equation. Gradients increased due to Afib with RVR  4. The aortic valve is tricuspid. Aortic valve regurgitation is not visualized. Mild to moderate aortic valve sclerosis/calcification is present, without any evidence of aortic stenosis.  5. The inferior vena cava is normal in size with greater than 50% respiratory variability, suggesting right atrial pressure of 3 mmHg. FINDINGS  Left Ventricle: Left ventricular ejection fraction, by estimation, is 50 to 55%. The left ventricle has low normal function. The left ventricle has no regional wall motion abnormalities. The left ventricular internal cavity size was small. There is moderate asymmetric left ventricular hypertrophy of the basal-septal segment. Left ventricular diastolic parameters are indeterminate. Right Ventricle: The right ventricular size is normal. Right vetricular wall thickness was not assessed. Right ventricular systolic function is mildly reduced. There is mildly elevated pulmonary artery systolic pressure. The tricuspid regurgitant velocity is 2.84 m/s, and with an assumed right atrial pressure of 3 mmHg, the estimated right ventricular systolic pressure is 62.8 mmHg. Left  Atrium: Left atrial size was normal in size. Right Atrium: Right atrial size was normal in size. Pericardium: There is no evidence of pericardial effusion. Mitral Valve: The mitral valve is degenerative in appearance. Severe mitral annular calcification. Trivial mitral valve regurgitation. Severe mitral valve stenosis. MV peak gradient, 24.3 mmHg. The mean mitral valve gradient is 11.0 mmHg. Tricuspid Valve: The tricuspid valve is normal in structure. Tricuspid valve regurgitation is trivial. Aortic Valve: The aortic valve is tricuspid. Aortic valve  regurgitation is not visualized. Mild to moderate aortic valve sclerosis/calcification is present, without any evidence of aortic stenosis. Pulmonic Valve: The pulmonic valve was not well visualized. Pulmonic valve regurgitation is not visualized. Aorta: The aortic root and ascending aorta are structurally normal, with no evidence of dilitation. Venous: The inferior vena cava is normal in size with greater than 50% respiratory variability, suggesting right atrial pressure of 3 mmHg. IAS/Shunts: No atrial level shunt detected by color flow Doppler.  LEFT VENTRICLE PLAX 2D LVIDd:         3.30 cm LVIDs:         2.80 cm LV PW:         1.30 cm LV IVS:        1.40 cm LVOT diam:     1.60 cm LV SV:         27 LV SV Index:   17 LVOT Area:     2.01 cm  LEFT ATRIUM             Index       RIGHT ATRIUM           Index LA diam:        4.20 cm 2.71 cm/m  RA Area:     10.50 cm LA Vol (A2C):   27.9 ml 18.01 ml/m RA Volume:   19.00 ml  12.27 ml/m LA Vol (A4C):   43.4 ml 28.02 ml/m LA Biplane Vol: 35.6 ml 22.98 ml/m  AORTIC VALVE LVOT Vmax:   75.90 cm/s LVOT Vmean:  51.500 cm/s LVOT VTI:    0.134 m  AORTA Ao Root diam: 2.60 cm MITRAL VALVE                TRICUSPID VALVE MV Area (PHT): 1.93 cm     TR Peak grad:   32.3 mmHg MV Peak grad:  24.3 mmHg    TR Vmax:        284.00 cm/s MV Mean grad:  11.0 mmHg MV Vmax:       2.46 m/s     SHUNTS MV Vmean:      159.0 cm/s   Systemic VTI:  0.13 m MV Decel Time: 393 msec     Systemic Diam: 1.60 cm MV E velocity: 179.33 cm/s Oswaldo Milian MD Electronically signed by Oswaldo Milian MD Signature Date/Time: 03/12/2020/4:50:57 PM    Final (Updated)    CT Maxillofacial Wo Contrast  Result Date: 03/11/2020 CLINICAL DATA:  74 year old female with facial trauma. EXAM: CT HEAD WITHOUT CONTRAST CT MAXILLOFACIAL WITHOUT CONTRAST CT CERVICAL SPINE WITHOUT CONTRAST TECHNIQUE: Multidetector CT imaging of the head, cervical spine, and maxillofacial structures were performed using the  standard protocol without intravenous contrast. Multiplanar CT image reconstructions of the cervical spine and maxillofacial structures were also generated. COMPARISON:  None. FINDINGS: Evaluation of this exam is limited due to motion artifact. CT HEAD FINDINGS Brain: There is moderate age-related atrophy and chronic microvascular ischemic changes. There is a small right frontal convexity subarachnoid hemorrhage. No  mass effect or midline shift. Vascular: No hyperdense vessel or unexpected calcification. Skull: Normal. Negative for fracture or focal lesion. Other: None CT MAXILLOFACIAL FINDINGS Osseous: No acute fracture. No mandibular subluxation. Orbits: The globes and retro-orbital fat are preserved. Sinuses: Chronic appearing complete opacification of the left maxillary sinus and ethmoid air cells with remodeling of the surrounding bones. No air-fluid level. The mastoid air cells are clear. Soft tissues: Negative. CT CERVICAL SPINE FINDINGS Alignment: No acute subluxation. There is reversal of normal cervical lordosis which may be positional or due to chronic spasm. Skull base and vertebrae: No acute fracture. Evaluation however is limited due to respiratory motion artifact. Soft tissues and spinal canal: No prevertebral fluid or swelling. No visible canal hematoma. Disc levels: Multilevel degenerative changes with multilevel facet arthropathy. Upper chest: Partially visualized right pleural effusion. Other: Bilateral carotid bulb calcified plaques. IMPRESSION: 1. Small right frontal convexity subarachnoid hemorrhage. 2. No acute/traumatic cervical spine pathology. 3. No acute facial bone fractures. 4. Chronic left paranasal sinus disease. These results were called by telephone at the time of interpretation on 03/11/2020 at 9:09 pm to provider [facility] , who verbally acknowledged these results. Electronically Signed   By: Anner Crete M.D.   On: 03/11/2020 21:08   Telemetry    03/13/20 AF with rates  in the 90-110's, episodes in the 120-140 range when agitated- Personally Reviewed  ECG    No new tracing as of 03/13/20 - Personally Reviewed  Cardiac Studies   Echocardiogram 03/12/2020:  1. Left ventricular ejection fraction, by estimation, is 50 to 55%. The  left ventricle has low normal function. The left ventricle has no regional  wall motion abnormalities. There is moderate asymmetric left ventricular  hypertrophy of the basal-septal  segment. Left ventricular diastolic parameters are indeterminate.  2. Right ventricular systolic function is mildly reduced. The right  ventricular size is normal. There is mildly elevated pulmonary artery  systolic pressure. The estimated right ventricular systolic pressure is  93.7 mmHg.  3. The mitral valve is degenerative. Severe mitral annular calcification.  Trivial mitral valve regurgitation. Severe mitral stenosis. MG 12 mmHg at  HR 105 bpm, MVA 0.7cm^2 by continuity equation. Gradients increased due to  Afib with RVR  4. The aortic valve is tricuspid. Aortic valve regurgitation is not  visualized. Mild to moderate aortic valve sclerosis/calcification is  present, without any evidence of aortic stenosis.  5. The inferior vena cava is normal in size with greater than 50%  respiratory variability, suggesting right atrial pressure of 3 mmHg.   Patient Profile     74 y.o. female with a hx of COPD, hypertension, DM2, hyperlipidemia, CKD stage II, anemia, depression and fibromyalgia who is being seen today for the evaluation of new onset atrial fibrillation with CHF at the request of Dr Flossie Buffy.  Assessment & Plan    1.  New onset atrial fibrillation with RVR: -Patient presented to [facility] 03/11/2020 after several recurrent falls and found to have [place] per head CT. On ED presentation, patient found to be in new onset atrial fibrillation with RVR. Initially treated with IV diltiazem bolus and infusion however she became hypotensive therefore this was  held then restarted due to rising rates. No anticoagulation was started secondary to subdural hemorrhage per primary and neurosurgery recommendation. -Echocardiogram with normal LVEF, LVH and severe mitral annular calcification with severe mitral stenosis. MG 12 mmHg at HR 105 bpm, MVA 0.7cm^2 by continuity equation. Gradients increased due to Afib with RVR>>>likley will need to  repeat echo once rates are better controlled. Acute respiratory failure likely in the setting of underlying poor lung function and some degree of severity of MS exacerbated by AF with RVR -Rates remain elevated in the 90-110 range. Low-dose digoxin added to regimen for now.  -IV Diltiazem 69m Q6H restarted per PCCM>>agree  -Continue digoxin 0.125 mg daily -Watch BP closely   2.  Small right frontal subarachnoid hemorrhage status post recent fall: -As above, patient presented after several recurrent falls, initial fall causing injury felt to be approximately 5 days ago. Repeat head CT today per neurosurgery with no progression  -No AC until ok per primary and neuro -Management per primary/neurosurgery  3.  Hypotension: -Felt to be secondary to diltiazem, initially started for the treatment of atrial fibrillation with RVR -Hypotension resolved with IV fluids per primary team -Currently on IV Diltiazem 613mQ6H -BP, 107/68>91/60>>120/62  4.  Acute respiratory distress: -Patient's O2 saturations dropped overnight with acute shortness of breath therefore patient became agitated and received Ativan however subsequently became unresponsive.  She was initially placed on BiPAP however ABG obtained which showed a pH of 7.22 and PCO2 of 58 therefore PCCM was consulted for airway management.   -Stat chest CT showed bilateral pleural effusions with diffuse interstitial pulmonary edema -Patient started on duo nebs, Solu-Medrol, lasix  -Management per PCCM    Signed, JiKathyrn DrownP-C HeMadisonager:  [phone] 1984/30/2021, 7:47 AM     For questions or updates, please contact   Please consult [email or web address] for contact info under Cardiology/STEMI.   I have seen and examined the patient along with JiKathyrn DrownP-C.  I have reviewed the chart, notes and new data.  I agree with PA/NP's note.  Key new complaints: intubated sedated. Discussed with her son, who is a phSoftware engineerShe was diagnosed with moderate mitral stenosis at least 5 years ago and started on metoprolol and bumetanide. The diuretic was dropped about 2 years ago since it appeared unnecessary and she was asymptomatic. Atrial fibrillation is a new diagnosis. Key examination changes: remains in AFib w RVR. I do not hear a rumble, lungs are clear Key new findings / data: imaging studies are c/w pulmonary edema, ABG with marked hypercarbia reflecting her underlying COPD.  PLAN: Rate control is a priority to reduce the transmitral gradients. Ideally would like her rate under 70 bpm. Due to her low BP, the best option is amiodarone. With amio, there is a small risk of conversion to NSR and embolic stroke. She cannot be anticoagulated due to subarachnoid hemorrhage (and is a poor candidate for long term anticoagulation due to her propensity to fall). Would continue diltiazem and digoxin until we reach good ventricular rates, maybe in a few days drop the digoxin to every other day due to [facility]. Prognosis is poor with her multiple comorbid conditions.  MiSanda KleinMD, [phone]/30/2021, 11:02 AM

## 2020-03-13 NOTE — Progress Notes (Signed)
eLink Physician-Brief Progress Note Patient Name: Dana [name] DOB: 1946-03-29 MRN: [id number]   Date of Service  03/13/2020  HPI/Events of Note  Patient with acute respiratory failure due to pulmonary edema, she also has altered mental status.  eICU Interventions  Anesthesia requested to intubate patient due to urgency of the task, PCCM  Bedside has been notified and are on their way to see patient, I spoke with her son Arlayne Liggins and updated him on her change in status and obtained permission for the intubation.        Thomasene Lot Eileen Croswell 03/13/2020, 6:42 AM

## 2020-03-13 NOTE — Progress Notes (Addendum)
Patient's in new onset afib with HR sustaining between 110s & 120s with no complaints. During the previous day she was transitioned from IV cardizem to PO. Paged MD on call. New order for cardizem IV push ordered and given. Will continue to monitor.   Patient's O2 also dropped to 84% on room air. Patient also gets very SOB on exertion. Paged Dr. Arville Care. Multiple new orders placed in Epic including O2, IV solumedrol, CT angio, IV lasix, etc. Will continue to monitor.

## 2020-03-14 ENCOUNTER — Inpatient Hospital Stay (HOSPITAL_COMMUNITY): Payer: Medicare Other

## 2020-03-14 LAB — BASIC METABOLIC PANEL
Anion gap: 13 (ref 5–15)
BUN: 18 mg/dL (ref 8–23)
CO2: 22 mmol/L (ref 22–32)
Calcium: 9.1 mg/dL (ref 8.9–10.3)
Chloride: 92 mmol/L — ABNORMAL LOW (ref 98–111)
Creatinine, Ser: 0.81 mg/dL (ref 0.44–1.00)
GFR calc Af Amer: >60 mL/min (ref >60)
GFR calc non Af Amer: >60 mL/min (ref >60)
Glucose, Bld: 198 mg/dL — ABNORMAL HIGH (ref 70–99)
Potassium: 3.9 mmol/L (ref 3.5–5.1)
Sodium: 127 mmol/L — ABNORMAL LOW (ref 135–145)

## 2020-03-14 LAB — CBC
HCT: 38.2 % (ref 36.0–46.0)
Hemoglobin: 13 g/dL (ref 12.0–15.0)
MCH: 31.9 pg (ref 26.0–34.0)
MCHC: 34 g/dL (ref 30.0–36.0)
MCV: 93.6 fL (ref 80.0–100.0)
Platelets: 233 10*3/uL (ref 150–400)
RBC: 4.08 MIL/uL (ref 3.87–5.11)
RDW: 13.4 % (ref 11.5–15.5)
WBC: 16.3 10*3/uL — ABNORMAL HIGH (ref 4.0–10.5)
nRBC: 0 % (ref 0.0–0.2)

## 2020-03-14 LAB — GLUCOSE, CAPILLARY
Glucose-Capillary: 119 mg/dL — ABNORMAL HIGH (ref 70–99)
Glucose-Capillary: 131 mg/dL — ABNORMAL HIGH (ref 70–99)
Glucose-Capillary: 168 mg/dL — ABNORMAL HIGH (ref 70–99)
Glucose-Capillary: 185 mg/dL — ABNORMAL HIGH (ref 70–99)
Glucose-Capillary: 201 mg/dL — ABNORMAL HIGH (ref 70–99)
Glucose-Capillary: 81 mg/dL (ref 70–99)

## 2020-03-14 LAB — PHOSPHORUS: Phosphorus: 2.9 mg/dL (ref 2.5–4.6)

## 2020-03-14 LAB — MAGNESIUM: Magnesium: 1.4 mg/dL — ABNORMAL LOW (ref 1.7–2.4)

## 2020-03-14 MED ORDER — IPRATROPIUM BROMIDE 0.02 % IN SOLN
0.5000 mg | Freq: Four times a day (QID) | RESPIRATORY_TRACT | Status: DC
  Administered 2020-03-14 – 2020-03-17 (×6): 0.5 mg via RESPIRATORY_TRACT
  Filled 2020-03-14 (×10): qty 2.5

## 2020-03-14 MED ORDER — FUROSEMIDE 10 MG/ML IJ SOLN
40.0000 mg | Freq: Once | INTRAMUSCULAR | Status: AC
  Administered 2020-03-14: 40 mg via INTRAVENOUS
  Filled 2020-03-14: qty 4

## 2020-03-14 MED ORDER — ORAL CARE MOUTH RINSE
15.0000 mL | Freq: Two times a day (BID) | OROMUCOSAL | Status: DC
  Administered 2020-03-15 – 2020-03-16 (×3): 15 mL via OROMUCOSAL

## 2020-03-14 MED ORDER — MAGNESIUM SULFATE 4 GM/100ML IV SOLN
4.0000 g | Freq: Once | INTRAVENOUS | Status: AC
  Administered 2020-03-14: 4 g via INTRAVENOUS
  Filled 2020-03-14: qty 100

## 2020-03-14 MED ORDER — SODIUM CHLORIDE 0.9 % IV SOLN
1.0000 g | INTRAVENOUS | Status: DC
  Administered 2020-03-14 – 2020-03-16 (×3): 1 g via INTRAVENOUS
  Filled 2020-03-14 (×3): qty 10

## 2020-03-14 MED ORDER — SODIUM CHLORIDE 3 % IN NEBU
4.0000 mL | INHALATION_SOLUTION | Freq: Two times a day (BID) | RESPIRATORY_TRACT | Status: AC
  Administered 2020-03-14 – 2020-03-16 (×3): 4 mL via RESPIRATORY_TRACT
  Filled 2020-03-14 (×6): qty 4

## 2020-03-14 NOTE — Progress Notes (Signed)
NAME:  Dana [name], MRN:  [id number], DOB:  01-26-1946, LOS: 2 ADMISSION DATE:  03/11/2020, CONSULTATION DATE:  7/30 REFERRING MD:  Dr. Marland Mcalpine, CHIEF COMPLAINT:  Hypoxia   Brief History   74 year old female admitted after fall and found to have SAH and new onset AF. 7/30 AM she developed respiratory distress requiring intubation prompting PCCM consultation.   History of present illness   Patient is encephalopathic and/or intubated. Therefore history has been obtained from chart review.   74 year old female with PMH as below, which is significant for DM, COPD, HTN, HLD. She has a recent history of neurological decline with concern for dementia. She had been suffering multiple falls at home of late as well. She lives by herself. She fell approximately 7/24 with impact to head and a facial laceration. She did not present at that time, but on 7/28 she was observed falling onto couch and was brought to [facility] ED. Upon arrival to the ED she was found to be in atrial fibrillation, which is a new diagnosis. She was started on diltiazem, which was stopped due to hypotension. CT of the head demonstrated small frontal SAH. Lab eval significant for Na 121, down from baseline of about 127. She was admitted to the hospitalists service with cardiology consultation and was started on diltiazem. In the morning hours of 7/30, she developed respiratory distress prompting CTA of the chest, which was concerning for volume overload. Upon arriving from CT, she became profoundly hypoxic with O2 sats in the 70s. Her distress continued despite 15L NRB and she was intubated by anesthesia. PCCM consulted.   Past Medical History   has a past medical history of Asthma, Depression, Diabetes mellitus without complication (HCC), Emphysema of lung (HCC), Heart disease, Hyperlipidemia, Hypertension, and Irritable bowel syndrome.  Significant Hospital Events   7/28 admit 7/30 respiratory decompensation resulting in  intubation.   Consults:  Cardiology PCCM  Procedures:  ETT 7/30 >7/31  Significant Diagnostic Tests:  CT head 7/28 > Small right frontal convexity subarachnoid hemorrhage. CT head 7/29 > Stable small volume sulcal subarachnoid hemorrhage. Echo 7/29 > LVEF 50-55%, no regional wall motion abnormalities. Severe mitral stenosis CTA chest 7/30 > No demonstrable pulmonary embolus. Bilateral pleural effusions with diffuse interstitial pulmonary edema. Areas of patchy airspace opacity likely represent patchy alveolar edema.  Micro Data:  resp 7/30 >>rare GPC >>  Antimicrobials:    Interim history/subjective:   Critically ill, intubated Hemodynamically stable last 24 hours, soft blood pressure Diuresed 2 L Afebrile   Objective   Blood pressure 120/73, pulse 88, temperature 98.6 F (37 C), temperature source Axillary, resp. rate 22, height 5\' 1"  (1.549 m), weight 56.9 kg, SpO2 91 %.    Vent Mode: PSV FiO2 (%):  [40 %] 40 % Set Rate:  [20 bmp] 20 bmp Vt Set:  [380 mL] 380 mL PEEP:  [5 cmH20] 5 cmH20 Plateau Pressure:  [14 cmH20-42 cmH20] 42 cmH20   Intake/Output Summary (Last 24 hours) at 03/14/2020 0943 Last data filed at 03/14/2020 0700 Gross per 24 hour  Intake 743.88 ml  Output 2200 ml  Net -1456.12 ml   Filed Weights   03/11/20 1849  Weight: 56.9 kg    Examination: General: Elderly woman,  no distress, intubated HENT: Mild pallor, no icterus, no JVD Lungs: Decreased breath sounds bilateral, no rhonchi Cardiovascular: S1-S2 irregular, tacky, atrial fibrillation on monitor Abdomen: Soft, nontender Extremities: No edema, no deformity Neuro: Calm, RASS -1, pupils 3 mm  PERRTL GU: Clear urine  Chest x-ray personally reviewed which shows decreased edema, large gastric bubble Labs show hyperglycemia, hyponatremia , mild leukocytosis , hypomagnesemia  Resolved Hospital Problem list     Assessment & Plan:   Acute hypoxemic/hypercarbic respiratory failure secondary  to pulmonary edema and bilateral pleural effusions.  Underlying COPD -She weaned on pressure support 5/5 and was extubated - Diurese   Subarachnoid hemorrhage: small frontal bleed, likely traumatic - No surgical intervention.  -Repeat head CT does not show any change which is reassuring -No subcu heparin  COPD +/- acute exacerbation -IV Solu-Medrol 40 every 8 , will start tapering 24 hours - atrovent Nebs q 6h, using Xopenex prn for fear of causing tachyarrhythmias  Hyponatremia: acute on chronic -  Acute pulmonary edema Atrial fibrillation with RVR, new onset.  Moderate -severe MS -Rate control most important. - digoxin -Cardizem 60 every 6 with hold parameters -Amiodarone drip per cardiology - NO anticoagulation in setting of SAH  Uncontrolled hyperglycemia, due to steroids -Add Levemir 10 units SSI resistant scale  Goals of care: patient has been declining per family over the past few months and there are concerns for dementia.  Reviewed living well brought in by son and No CPR no cardioversion issued.  Will need to reassess going forward   Best practice:  Diet: NPO Pain/Anxiety/Delirium protocol (if indicated): Per Protocol VAP protocol (if indicated): Per protocol DVT prophylaxis: SCDs GI prophylaxis: PPI Glucose control: SSI Mobility: BR Code Status: FULL Family Communication: Son updated at bedside Disposition: ICU   Critical care time: 35 minutes     Cyril Mourning MD. [facility]. [facility]  If no response to pager , please call [phone]   03/14/2020

## 2020-03-14 NOTE — Progress Notes (Signed)
[facility] ADULT ICU REPLACEMENT PROTOCOL   The patient does apply for the [facility] Adult ICU Electrolyte Replacment Protocol based on the criteria listed below:   1. Is GFR >/= 30 ml/min? Yes.    Patient's GFR today is >60 2. Is SCr </= 2? Yes.   Patient's SCr is 0.81 ml/kg/hr 3. Did SCr increase >/= 0.5 in 24 hours? 4. Abnormal electrolyte(s): Mg 1.4 5. Ordered repletion with: protocol 6. If a panic level lab has been reported, has the CCM MD in charge been notified? No..   Physician:    Markus Daft A 03/14/2020 5:10 AM

## 2020-03-14 NOTE — Progress Notes (Signed)
Elink informed re pt low urine output. No new orders at this time.

## 2020-03-14 NOTE — Progress Notes (Signed)
Progress Note  Patient Name: Dana [name] Date of Encounter: 03/14/2020  [facility] Cardiologist: No primary care provider on file.   Subjective   Resting comfortably.  Son in room, pharmacist.    Inpatient Medications    Scheduled Meds: . chlorhexidine gluconate (MEDLINE KIT)  15 mL Mouth Rinse BID  . Chlorhexidine Gluconate Cloth  6 each Topical Daily  . digoxin  0.0625 mg Oral Daily  . diltiazem  60 mg Oral Q6H  . docusate  100 mg Oral BID  . guaiFENesin  600 mg Oral BID  . insulin aspart  0-9 Units Subcutaneous Q4H  . insulin detemir  10 Units Subcutaneous Daily  . ipratropium  0.5 mg Nebulization Q6H  . iron polysaccharides  150 mg Oral Daily  . mouth rinse  15 mL Mouth Rinse 10 times per day  . methylPREDNISolone (SOLU-MEDROL) injection  40 mg Intravenous Q8H  . Milnacipran  50 mg Oral BID  . mirtazapine  30 mg Oral QHS  . montelukast  10 mg Oral QHS  .  morphine injection  2 mg Intravenous Once  . nicotine  21 mg Transdermal Daily  . polyethylene glycol  17 g Oral Daily  . pravastatin  40 mg Oral q1800   Continuous Infusions: . amiodarone 30 mg/hr (03/14/20 0700)   PRN Meds: levalbuterol, midazolam, midazolam   Vital Signs    Vitals:   03/14/20 0800 03/14/20 0833 03/14/20 0900 03/14/20 0915  BP:   111/70   Pulse: 88  (!) 115   Resp: 22  22   Temp:      TempSrc:      SpO2:  92% 95% 91%  Weight:      Height:        Intake/Output Summary (Last 24 hours) at 03/14/2020 1001 Last data filed at 03/14/2020 0700 Gross per 24 hour  Intake 743.88 ml  Output 1850 ml  Net -1106.12 ml   Last 3 Weights 03/11/2020 12/20/2019 04/02/2019  Weight (lbs) 125 lb 7.1 oz 125 lb 6 oz 122 lb 12.8 oz  Weight (kg) 56.9 kg 56.87 kg 55.702 kg      Telemetry    Atrial fibrillation heart rate on average less than 110- Personally Reviewed  ECG    No new- Personally Reviewed  Physical Exam   GEN: No acute distress.  Appearing Neck: No JVD Cardiac:   Irregularly irregular mildly tachycardic, no murmurs, rubs, or gallops.  Respiratory: Clear to auscultation bilaterally. GI: Soft, nontender, non-distended  MS: No edema; No deformity. Neuro:  Nonfocal  Psych: Normal affect   Labs    High Sensitivity Troponin:   Recent Labs  Lab 03/12/20 0052 03/12/20 0404  TROPONINIHS 13 12      Chemistry Recent Labs  Lab 03/11/20 2035 03/12/20 0052 03/12/20 0404 03/12/20 0404 03/13/20 0537 03/13/20 0558 03/14/20 0239  NA 119*   < > 123*   < > 121* 123* 127*  K 5.8*   < > 4.3   < > 4.8 4.1 3.9  CL 87*   < > 92*  --  88*  --  92*  CO2 22   < > 21*  --  20*  --  22  GLUCOSE 220*   < > 196*  --  337*  --  198*  BUN 21   < > 18  --  13  --  18  CREATININE 0.61   < > 0.50  --  0.79  --  0.81  CALCIUM 9.5   < > 9.3  --  9.1  --  9.1  PROT 7.5  --  6.5  --  7.0  --   --   ALBUMIN 4.4  --  3.8  --  4.1  --   --   AST 20  --  18  --  52*  --   --   ALT 21  --  20  --  17  --   --   ALKPHOS 86  --  73  --  90  --   --   BILITOT 1.0  --  0.7  --  2.2*  --   --   GFRNONAA >60   < > >60  --  >60  --  >60  GFRAA >60   < > >60  --  >60  --  >60  ANIONGAP 10   < > 10  --  13  --  13   < > = values in this interval not displayed.     Hematology Recent Labs  Lab 03/12/20 0052 03/12/20 0052 03/13/20 0537 03/13/20 0558 03/14/20 0239  WBC 6.9  --  14.0*  --  16.3*  RBC 4.32  --  4.47  --  4.08  HGB 13.6   < > 14.7 15.3* 13.0  HCT 40.7   < > 43.2 45.0 38.2  MCV 94.2  --  96.6  --  93.6  MCH 31.5  --  32.9  --  31.9  MCHC 33.4  --  34.0  --  34.0  RDW 13.3  --  13.4  --  13.4  PLT 259  --  310  --  233   < > = values in this interval not displayed.    BNPNo results for input(s): BNP, PROBNP in the last 168 hours.   DDimer No results for input(s): DDIMER in the last 168 hours.   Radiology    CT HEAD WO CONTRAST  Result Date: 03/13/2020 CLINICAL DATA:  Subarachnoid hemorrhage EXAM: CT HEAD WITHOUT CONTRAST TECHNIQUE: Contiguous  axial images were obtained from the base of the skull through the vertex without intravenous contrast. COMPARISON:  March 12, 2020, March 11, 2020 FINDINGS: Brain: Similar appearance of subarachnoid hemorrhage overlying the RIGHT frontal convexity. Unchanged size and configuration of the ventricular system. No new hemorrhage. Periventricular white matter hypodensities consistent with sequela of chronic microvascular ischemic disease. Vascular: Conspicuity of the vessels secondary to recent contrast administration. Skull: No acute finding. Sinuses/Orbits: Chronic sinus disease of the LEFT maxillary sinus, frontal sinus, and ethmoid air cells. Status post cataract surgery. Other: None IMPRESSION: Similar appearance of subarachnoid hemorrhage overlying the RIGHT frontal convexity. No new hemorrhage. Electronically Signed   By: Meda Klinefelter MD   On: 03/13/2020 07:55   CT ANGIO CHEST PE W OR WO CONTRAST  Result Date: 03/13/2020 CLINICAL DATA:  Shortness of breath EXAM: CT ANGIOGRAPHY CHEST WITH CONTRAST TECHNIQUE: Multidetector CT imaging of the chest was performed using the standard protocol during bolus administration of intravenous contrast. Multiplanar CT image reconstructions and MIPs were obtained to evaluate the vascular anatomy. CONTRAST:  38mL OMNIPAQUE IOHEXOL 350 MG/ML SOLN COMPARISON:  Chest radiograph March 11, 2020 FINDINGS: Cardiovascular: There is no demonstrable pulmonary embolus. There is no thoracic aortic aneurysm. There is no obvious dissection. Note that the contrast bolus within the aorta is not sufficient for assessment for possess 10 Scholl aortic dissection. There are foci of calcification in proximal visualized great vessels.  There is aortic atherosclerosis. There are multiple foci of coronary artery calcification. A small amount of pericardial fluid is within physiologic range. There is no pericardial thickening. Heart is mildly enlarged. Mediastinum/Nodes: Visualized thyroid appears  unremarkable. There are subcentimeter mediastinal lymph nodes. There is no appreciable thoracic adenopathy by size criteria. No esophageal lesions are appreciable. Lungs/Pleura: There are moderate free-flowing pleural effusions bilaterally. There is diffuse interstitial pulmonary edema areas of patchy airspace opacity bilaterally likely represent alveolar edema. Upper Abdomen: There is reflux of contrast into the inferior vena cava and hepatic veins. There is abdominal aortic and splenic artery calcification. Visualized upper abdominal structures otherwise appear normal. Musculoskeletal: No blastic or lytic bone lesions. No chest wall lesions evident. Review of the MIP images confirms the above findings. IMPRESSION: 1. No demonstrable pulmonary embolus. No thoracic aortic aneurysm. No dissection seen; note that the contrast bolus in the aorta is not sufficient for potential dissection assessment. There is aortic atherosclerosis as well as great vessel and coronary artery calcification. 2. Bilateral pleural effusions with diffuse interstitial pulmonary edema. Areas of patchy airspace opacity likely represent patchy alveolar edema. The overall appearance is felt to be most indicative of a degree of congestive heart failure. 3. Reflux of contrast into the inferior vena cava and hepatic veins is likely indicative of increase in right heart pressure. Aortic Atherosclerosis (ICD10-I70.0). Electronically Signed   By: Lowella Grip III M.D.   On: 03/13/2020 04:56   Portable Chest x-ray  Result Date: 03/13/2020 CLINICAL DATA:  Endotracheal tube placement.  OG tube placement. EXAM: PORTABLE CHEST 1 VIEW COMPARISON:  03/11/2020. FINDINGS: Endotracheal tube noted with its tip 2 cm above the carina. NG tube noted with tip below left hemidiaphragm. Heart size normal. Progressive bilateral pulmonary infiltrates/edema. No pleural effusion or pneumothorax. IMPRESSION: 1.  Endotracheal tube and NG tube noted good anatomic  position. 2. Diffuse bilateral pulmonary infiltrates/edema, progressed from prior exam. Electronically Signed   By: [place]   On: 03/13/2020 07:36   DG Abd Portable 1V  Result Date: 03/13/2020 CLINICAL DATA:  Endotracheal and OG tube placement. EXAM: PORTABLE ABDOMEN - 1 VIEW COMPARISON:  Chest x-ray 03/11/2020. FINDINGS: NG tube noted with its tip over the stomach in good anatomic position. Mild small and large bowel distention cannot be excluded. Bilateral pulmonary infiltrates. IMPRESSION: 1. NG tube noted with its tip over the stomach in good anatomic position. Mild small large bowel distention cannot be excluded. 2.  Bilateral pulmonary infiltrates. Electronically Signed   By: Marcello Moores  Register   On: 03/13/2020 07:37   ECHOCARDIOGRAM COMPLETE  Result Date: 03/12/2020    ECHOCARDIOGRAM REPORT   Patient Name:   Dana [name] Date of Exam: 03/12/2020 Medical Rec #:  [id number]            Height:       61.0 in Accession #:    [id number]           Weight:       125.4 lb Date of Birth:  [birth year]             BSA:          1.549 m Patient Age:    48 years             BP:           94/68 mmHg Patient Gender: F                    HR:  115 bpm. Exam Location:  Inpatient Procedure: 2D Echo Indications:     Atrial Fibrillation 427.31 / I48.91  History:         Patient has no prior history of Echocardiogram examinations.                  COPD; Risk Factors:Diabetes, Current Smoker, Hypertension and                  Dyslipidemia. Chronic kidney disease, anemia. Emphysema.  Sonographer:     Darlina Sicilian RDCS Referring Phys:  [id number] [facility] Diagnosing Phys: Oswaldo Milian MD IMPRESSIONS  1. Left ventricular ejection fraction, by estimation, is 50 to 55%. The left ventricle has low normal function. The left ventricle has no regional wall motion abnormalities. There is moderate asymmetric left ventricular hypertrophy of the basal-septal segment. Left ventricular diastolic parameters are  indeterminate.  2. Right ventricular systolic function is mildly reduced. The right ventricular size is normal. There is mildly elevated pulmonary artery systolic pressure. The estimated right ventricular systolic pressure is 01.7 mmHg.  3. The mitral valve is degenerative. Severe mitral annular calcification. Trivial mitral valve regurgitation. Severe mitral stenosis. MG 12 mmHg at HR 105 bpm, MVA 0.7cm^2 by continuity equation. Gradients increased due to Afib with RVR  4. The aortic valve is tricuspid. Aortic valve regurgitation is not visualized. Mild to moderate aortic valve sclerosis/calcification is present, without any evidence of aortic stenosis.  5. The inferior vena cava is normal in size with greater than 50% respiratory variability, suggesting right atrial pressure of 3 mmHg. FINDINGS  Left Ventricle: Left ventricular ejection fraction, by estimation, is 50 to 55%. The left ventricle has low normal function. The left ventricle has no regional wall motion abnormalities. The left ventricular internal cavity size was small. There is moderate asymmetric left ventricular hypertrophy of the basal-septal segment. Left ventricular diastolic parameters are indeterminate. Right Ventricle: The right ventricular size is normal. Right vetricular wall thickness was not assessed. Right ventricular systolic function is mildly reduced. There is mildly elevated pulmonary artery systolic pressure. The tricuspid regurgitant velocity is 2.84 m/s, and with an assumed right atrial pressure of 3 mmHg, the estimated right ventricular systolic pressure is 49.4 mmHg. Left Atrium: Left atrial size was normal in size. Right Atrium: Right atrial size was normal in size. Pericardium: There is no evidence of pericardial effusion. Mitral Valve: The mitral valve is degenerative in appearance. Severe mitral annular calcification. Trivial mitral valve regurgitation. Severe mitral valve stenosis. MV peak gradient, 24.3 mmHg. The mean mitral  valve gradient is 11.0 mmHg. Tricuspid Valve: The tricuspid valve is normal in structure. Tricuspid valve regurgitation is trivial. Aortic Valve: The aortic valve is tricuspid. Aortic valve regurgitation is not visualized. Mild to moderate aortic valve sclerosis/calcification is present, without any evidence of aortic stenosis. Pulmonic Valve: The pulmonic valve was not well visualized. Pulmonic valve regurgitation is not visualized. Aorta: The aortic root and ascending aorta are structurally normal, with no evidence of dilitation. Venous: The inferior vena cava is normal in size with greater than 50% respiratory variability, suggesting right atrial pressure of 3 mmHg. IAS/Shunts: No atrial level shunt detected by color flow Doppler.  LEFT VENTRICLE PLAX 2D LVIDd:         3.30 cm LVIDs:         2.80 cm LV PW:         1.30 cm LV IVS:        1.40 cm LVOT diam:  1.60 cm LV SV:         27 LV SV Index:   17 LVOT Area:     2.01 cm  LEFT ATRIUM             Index       RIGHT ATRIUM           Index LA diam:        4.20 cm 2.71 cm/m  RA Area:     10.50 cm LA Vol (A2C):   27.9 ml 18.01 ml/m RA Volume:   19.00 ml  12.27 ml/m LA Vol (A4C):   43.4 ml 28.02 ml/m LA Biplane Vol: 35.6 ml 22.98 ml/m  AORTIC VALVE LVOT Vmax:   75.90 cm/s LVOT Vmean:  51.500 cm/s LVOT VTI:    0.134 m  AORTA Ao Root diam: 2.60 cm MITRAL VALVE                TRICUSPID VALVE MV Area (PHT): 1.93 cm     TR Peak grad:   32.3 mmHg MV Peak grad:  24.3 mmHg    TR Vmax:        284.00 cm/s MV Mean grad:  11.0 mmHg MV Vmax:       2.46 m/s     SHUNTS MV Vmean:      159.0 cm/s   Systemic VTI:  0.13 m MV Decel Time: 393 msec     Systemic Diam: 1.60 cm MV E velocity: 179.33 cm/s Oswaldo Milian MD Electronically signed by Oswaldo Milian MD Signature Date/Time: 03/12/2020/4:50:57 PM    Final (Updated)     Patient Profile     74 y.o. female with subarachnoid hemorrhage COPD hyponatremia atrial fibrillation with rapid ventricular spots with  moderate severe mitral stenosis  Assessment & Plan    A. fib with RVR -Agree with amiodarone IV which was initiated on 03/13/2020.  Tomorrow, switch to p.o. if continued improvement in heart rate noted. -Continue with low-dose digoxin -Continue with Cardizem as well -No anticoagulation because of intracranial bleed.  Mitral stenosis -We would like to see as best rate control as possible.  This will improve overall cardiac function.  Subarachnoid hemorrhage per primary team.      For questions or updates, please contact [facility] Please consult [email or web address] for contact info under        Signed, Candee Furbish, MD  03/14/2020, 10:01 AM

## 2020-03-14 NOTE — Evaluation (Addendum)
Clinical/Bedside Swallow Evaluation Patient Details  Name: Marie Borowski MRN: [id number] Date of Birth: Mar 13, 1946  Today's Date: 03/14/2020 Time: SLP Start Time (ACUTE ONLY): 1645 SLP Stop Time (ACUTE ONLY): 1649 SLP Time Calculation (min) (ACUTE ONLY): 4 min  Past Medical History:  Past Medical History:  Diagnosis Date  . Asthma   . Depression   . Diabetes mellitus without complication (HCC)   . Emphysema of lung (HCC)   . Heart disease   . Hyperlipidemia   . Hypertension   . Irritable bowel syndrome    Past Surgical History:  Past Surgical History:  Procedure Laterality Date  . ABDOMINAL HYSTERECTOMY    . bladder tack    . CHOLECYSTECTOMY     HPI:  74 year old female with PMH as below, which is significant for DM, COPD, HTN, HLD. She has a recent history of neurological decline with concern for dementia. She had been suffering multiple falls at home of late as well. CT revealed R frontal SAH.  CXR 7/31: "1. Increased opacity at the lung bases consistent with enlargedpleural effusions.2. Persistent interstitial thickening and hazy airspace opacitiesconsistent with pulmonary edema.3. Stable well-positioned support apparatus."  Pt intubated ~24 hours, extubated 9 am 7/31   Assessment / Plan / Recommendation Clinical Impression  Pt presents with clinical indicators of pharyngeal dysphagia.  Cleared with RN to see pt.  Pt was able to rouse, but did not consistently follow directions for OME.  With thin liquid, there was immediate and prolonged wet cough.  Further bolus trials were deferred 2/2 respiratory status after discussion with MD. Given relatively short length of intubation, suspect there may also be some chronic element to swallowing difficulty at this time.  Pt will likely need instrumental swallow evaluation prior to initiation of PO diet, but is not appropriate at this time.  SLP to follow to determine readiness for further evaluation.   SLP Visit Diagnosis:  Dysphagia, oropharyngeal phase (R13.12)    Aspiration Risk  Moderate aspiration risk    Diet Recommendation NPO   Medication Administration: Via alternative means    Other  Recommendations Oral Care Recommendations: Oral care QID   Follow up Recommendations        Frequency and Duration min 2x/week  2 weeks       Prognosis   Good with continued recovery     Swallow Study   General Date of Onset: 03/11/20 HPI: 74 year old female with PMH as below, which is significant for DM, COPD, HTN, HLD. She has a recent history of neurological decline with concern for dementia. She had been suffering multiple falls at home of late as well. CT revealed R frontal SAH.  CXR 7/31: "1. Increased opacity at the lung bases consistent with enlargedpleural effusions.2. Persistent interstitial thickening and hazy airspace opacitiesconsistent with pulmonary edema.3. Stable well-positioned support apparatus."  Pt intubated ~24 hours, extubated 9 am 7/31 Type of Study: Bedside Swallow Evaluation Previous Swallow Assessment: None Diet Prior to this Study: NPO Temperature Spikes Noted: No Respiratory Status: Nasal cannula History of Recent Intubation: Yes Length of Intubations (days): 1 days Date extubated: 03/14/20 Behavior/Cognition: Requires cueing;Lethargic/Drowsy Oral Cavity - Dentition: Adequate natural dentition;Poor condition Patient Positioning: Upright in bed Baseline Vocal Quality: Not observed Volitional Cough: Cognitively unable to elicit Volitional Swallow: Unable to elicit    Oral/Motor/Sensory Function Overall Oral Motor/Sensory Function: Mild impairment Facial ROM: Reduced right;Reduced left Facial Symmetry: Within Functional Limits Lingual ROM: Within Functional Limits Lingual Symmetry: Within Functional Limits Lingual Strength:  Reduced Velum:  (unable to assess) Mandible:  (reduced excursion)   Ice Chips Ice chips: Not tested   Thin Liquid Thin Liquid: Impaired Presentation:  Cup Pharyngeal  Phase Impairments: Suspected delayed Swallow;Decreased hyoid-laryngeal movement;Cough - Immediate    Nectar Thick Nectar Thick Liquid: Not tested   Honey Thick Honey Thick Liquid: Not tested   Puree Puree: Not tested   Solid     Solid: Not tested      Kerrie Pleasure, MA, CCC-SLP Acute Rehabilitation Services Office: [phone] 03/14/2020,6:00 PM

## 2020-03-14 NOTE — Procedures (Signed)
Extubation Procedure Note  Patient Details:   Name: Dana [name] DOB: Aug 10, 1946 MRN: [id number]   Airway Documentation:    Vent end date: 03/14/20 Vent end time: 0905   Evaluation  O2 sats: stable throughout Complications: No apparent complications Patient did tolerate procedure well. Bilateral Breath Sounds: Diminished   Pt placed on 4L nasal cannula.  Renold Genta 03/14/2020, 9:07 AM

## 2020-03-15 DIAGNOSIS — J441 Chronic obstructive pulmonary disease with (acute) exacerbation: Secondary | ICD-10-CM

## 2020-03-15 DIAGNOSIS — J9600 Acute respiratory failure, unspecified whether with hypoxia or hypercapnia: Secondary | ICD-10-CM

## 2020-03-15 LAB — CULTURE, RESPIRATORY W GRAM STAIN
Culture: NORMAL
Gram Stain: NONE SEEN

## 2020-03-15 LAB — CBC
HCT: 43.7 % (ref 36.0–46.0)
Hemoglobin: 14.7 g/dL (ref 12.0–15.0)
MCH: 32.3 pg (ref 26.0–34.0)
MCHC: 33.6 g/dL (ref 30.0–36.0)
MCV: 96 fL (ref 80.0–100.0)
Platelets: 220 10*3/uL (ref 150–400)
RBC: 4.55 MIL/uL (ref 3.87–5.11)
RDW: 13.8 % (ref 11.5–15.5)
WBC: 10.7 10*3/uL — ABNORMAL HIGH (ref 4.0–10.5)
nRBC: 0 % (ref 0.0–0.2)

## 2020-03-15 LAB — BASIC METABOLIC PANEL
Anion gap: 11 (ref 5–15)
BUN: 26 mg/dL — ABNORMAL HIGH (ref 8–23)
CO2: 26 mmol/L (ref 22–32)
Calcium: 9 mg/dL (ref 8.9–10.3)
Chloride: 92 mmol/L — ABNORMAL LOW (ref 98–111)
Creatinine, Ser: 1.18 mg/dL — ABNORMAL HIGH (ref 0.44–1.00)
GFR calc Af Amer: 52 mL/min — ABNORMAL LOW (ref >60)
GFR calc non Af Amer: 45 mL/min — ABNORMAL LOW (ref >60)
Glucose, Bld: 82 mg/dL (ref 70–99)
Potassium: 4.6 mmol/L (ref 3.5–5.1)
Sodium: 129 mmol/L — ABNORMAL LOW (ref 135–145)

## 2020-03-15 LAB — GLUCOSE, CAPILLARY
Glucose-Capillary: 86 mg/dL (ref 70–99)
Glucose-Capillary: 80 mg/dL (ref 70–99)
Glucose-Capillary: 83 mg/dL (ref 70–99)
Glucose-Capillary: 149 mg/dL — ABNORMAL HIGH (ref 70–99)
Glucose-Capillary: 159 mg/dL — ABNORMAL HIGH (ref 70–99)
Glucose-Capillary: 197 mg/dL — ABNORMAL HIGH (ref 70–99)

## 2020-03-15 LAB — MAGNESIUM: Magnesium: 2.8 mg/dL — ABNORMAL HIGH (ref 1.7–2.4)

## 2020-03-15 MED ORDER — DILTIAZEM HCL-DEXTROSE 125-5 MG/125ML-% IV SOLN (PREMIX)
5.0000 mg/h | INTRAVENOUS | Status: DC
  Administered 2020-03-15: 5 mg/h via INTRAVENOUS
  Administered 2020-03-15: 15 mg/h via INTRAVENOUS
  Administered 2020-03-16: 10 mg/h via INTRAVENOUS
  Administered 2020-03-16 – 2020-03-17 (×3): 15 mg/h via INTRAVENOUS
  Filled 2020-03-15 (×5): qty 125

## 2020-03-15 NOTE — Progress Notes (Addendum)
NAME:  Dana [name], MRN:  [id number], DOB:  1946/01/12, LOS: 3 ADMISSION DATE:  03/11/2020, CONSULTATION DATE:  7/30 REFERRING MD:  Dr. Marland Mcalpine, CHIEF COMPLAINT:  Hypoxia   Brief History   74 year old female admitted after fall and found to have SAH and new onset AF/RVR. 7/30 AM she developed respiratory distress requiring intubation prompting PCCM consultation.   History of present illness   74 year old female with PMH as below, which is significant for DM, COPD, HTN, HLD. She has a recent history of neurological decline with concern for dementia. She had been suffering multiple falls at home of late as well. She lives by herself. She fell approximately 7/24 with impact to head and a facial laceration. She did not present at that time, but on 7/28 she was observed falling onto couch and was brought to [facility] ED. Upon arrival to the ED she was found to be in atrial fibrillation, which is a new diagnosis. She was started on diltiazem, which was stopped due to hypotension. CT of the head demonstrated small frontal SAH. Lab eval significant for Na 121, down from baseline of about 127. She was admitted to the hospitalists service with cardiology consultation and was started on diltiazem. In the morning hours of 7/30, she developed respiratory distress prompting CTA of the chest, which was concerning for volume overload. Upon arriving from CT, she became profoundly hypoxic with O2 sats in the 70s. Her distress continued despite 15L NRB and she was intubated by anesthesia. PCCM consulted.   Past Medical History   has a past medical history of Asthma, Depression, Diabetes mellitus without complication (HCC), Emphysema of lung (HCC), Heart disease, Hyperlipidemia, Hypertension, and Irritable bowel syndrome.  Significant Hospital Events   7/28 admit 7/30 respiratory decompensation resulting in intubation. LCB issued based on d/w son/ living will  Consults:  Cardiology PCCM  Procedures:  ETT  7/30 >7/31  Significant Diagnostic Tests:  CT head 7/28 > Small right frontal convexity subarachnoid hemorrhage. CT head 7/29 > Stable small volume sulcal subarachnoid hemorrhage. Echo 7/29 > LVEF 50-55%, no regional wall motion abnormalities. Severe mitral stenosis CTA chest 7/30 > No demonstrable pulmonary embolus. Bilateral pleural effusions with diffuse interstitial pulmonary edema. Areas of patchy airspace opacity likely represent patchy alveolar edema.  Micro Data:  resp 7/30 >>rare GPC >>  Antimicrobials:  ceftx 7/31 >>  Interim history/subjective:   Extubated yesterday but continues to struggle. Weak congested sounding cough Required increasing oxygen overnight, nonrebreather this morning Afebrile Poor urine output in spite of Lasix   Objective   Blood pressure (!) 130/56, pulse (!) 41, temperature 97.6 F (36.4 C), temperature source Axillary, resp. rate 17, height 5\' 1"  (1.549 m), weight 56.9 kg, SpO2 97 %.        Intake/Output Summary (Last 24 hours) at 03/15/2020 0955 Last data filed at 03/15/2020 0500 Gross per 24 hour  Intake 578.51 ml  Output 525 ml  Net 53.51 ml   Filed Weights   03/11/20 1849  Weight: 56.9 kg    Examination: General: Elderly woman, on NRB, mild distress HENT: Mild pallor, no icterus, no JVD Lungs: Mild accessory muscle use, decreased breath sounds bilateral, bilateral scattered rhonchi, croupy cough Cardiovascular: S1-S2 irregular, tacky, atrial fibrillation on monitor Abdomen: Soft, nontender Extremities: No edema, no deformity Neuro: Calm, nonfocal, pupils 3 mm PERRTL GU: Clear urine  Chest x-ray 7/31 personally reviewed which shows decreased edema, large gastric bubble Labs show mild hyponatremia , no leukocytosis  Resolved Hospital Problem list     Assessment & Plan:  Extubated but still struggling with hypoxia and bronchospasm, unable to diurese due to rising creatinine and pulmonary edema mostly driven by RVR in the  setting of mitral stenosis   Acute hypoxemic/hypercarbic respiratory failure secondary to pulmonary edema and bilateral pleural effusions.  Underlying COPD Smoker - extubated 7/31 -Decrease FiO2, drop down to nasal cannula -Hypertonic saline nebs, chest PT, flutter valve   Subarachnoid hemorrhage: small frontal bleed, likely traumatic - No surgical intervention.  -Repeat head CT does not show any change which is reassuring -NO heparin  COPD +/- acute exacerbation -ct IV Solu-Medrol 40 every 8 , still has significant bronchospasm - atrovent Nebs q 6h, using Xopenex prn for fear of causing tachyarrhythmias   Acute pulmonary edema Atrial fibrillation with RVR, new onset.  Moderate -severe MS -Rate control most important, changed to IV Cardizem with parameters -Convert to IV digoxin -Amiodarone drip per cardiology - NO anticoagulation in setting of SAH  AKI -developed increased creatinine with Lasix, hold for 24 hours  Hyponatremia: acute on chronic -Follow  Uncontrolled hyperglycemia, due to steroids -Hold Levemir while n.p.o. SSI resistant scale  Goals of care: patient has been declining per family over the past few months and there are concerns for dementia.  Discussed with patient now that she is more lucid with son present, clarify DNR including no intubation.  Full medical care   Best practice:  Diet: NPO Pain/Anxiety/Delirium protocol (if indicated): Per Protocol VAP protocol (if indicated): Per protocol DVT prophylaxis: SCDs GI prophylaxis: PPI Glucose control: SSI Mobility: BR Code Status: FULL Family Communication: Son  at bedside Disposition: ICU   Critical care time: 32 minutes     Cyril Mourning MD. FCCP. [facility]  If no response to pager , please call [phone]   03/15/2020

## 2020-03-15 NOTE — Progress Notes (Signed)
Patient is refusing Q2H repositioning and daily bath with CHG wipes. Pt states 'I wish you all would just leave me alone'. This RN will continue to encourage and provide daily cares as able.

## 2020-03-15 NOTE — Progress Notes (Signed)
`  RT  NT suctioned patient with no results. Patient did cough up some pinkish tan secretions:Patient tolerated well.

## 2020-03-15 NOTE — Progress Notes (Signed)
Progress Note  Patient Name: Dana [name] Date of Encounter: 03/15/2020  [facility] Cardiologist: No primary care provider on file.   Subjective   Alert, no complaints of chest discomfort.  Heart rate in the 110s to 120s atrial fibrillation  Inpatient Medications    Scheduled Meds: . Chlorhexidine Gluconate Cloth  6 each Topical Daily  . digoxin  0.0625 mg Oral Daily  . docusate  100 mg Oral BID  . guaiFENesin  600 mg Oral BID  . insulin aspart  0-9 Units Subcutaneous Q4H  . insulin detemir  10 Units Subcutaneous Daily  . ipratropium  0.5 mg Nebulization Q6H  . iron polysaccharides  150 mg Oral Daily  . mouth rinse  15 mL Mouth Rinse q12n4p  . methylPREDNISolone (SOLU-MEDROL) injection  40 mg Intravenous Q8H  . Milnacipran  50 mg Oral BID  . mirtazapine  30 mg Oral QHS  . montelukast  10 mg Oral QHS  .  morphine injection  2 mg Intravenous Once  . nicotine  21 mg Transdermal Daily  . polyethylene glycol  17 g Oral Daily  . pravastatin  40 mg Oral q1800  . sodium chloride HYPERTONIC  4 mL Nebulization BID   Continuous Infusions: . amiodarone 30 mg/hr (03/15/20 0500)  . cefTRIAXone (ROCEPHIN)  IV Stopped (03/14/20 1736)  . diltiazem (CARDIZEM) infusion 5 mg/hr (03/15/20 0817)   PRN Meds: levalbuterol, midazolam, midazolam   Vital Signs    Vitals:   03/15/20 0818 03/15/20 0900 03/15/20 0910 03/15/20 0911  BP:  (!) 130/56    Pulse:      Resp:  14 20 17   Temp:      TempSrc:      SpO2: 100%  97%   Weight:      Height:        Intake/Output Summary (Last 24 hours) at 03/15/2020 0918 Last data filed at 03/15/2020 0500 Gross per 24 hour  Intake 578.51 ml  Output 525 ml  Net 53.51 ml   Last 3 Weights 03/11/2020 12/20/2019 04/02/2019  Weight (lbs) 125 lb 7.1 oz 125 lb 6 oz 122 lb 12.8 oz  Weight (kg) 56.9 kg 56.87 kg 55.702 kg      Telemetry    Atrial fibrillation heart rate 110s to 130s- Personally Reviewed  ECG    No new- Personally  Reviewed  Physical Exam  GEN: In bed HEENT: normal  Neck: no JVD, carotid bruits, or masses Cardiac: Irregularly irregular difficult to appreciate diastolic murmur,no rubs, or gallops,no edema  Respiratory: Crackles bilaterally, normal work of breathing GI: soft, nontender, nondistended, + BS MS: no deformity or atrophy  Skin: warm and dry, no rash Neuro:  Alert and Oriented x 3, Strength and sensation are intact Psych: euthymic mood, full affect   Labs    High Sensitivity Troponin:   Recent Labs  Lab 03/12/20 0052 03/12/20 0404  TROPONINIHS 13 12      Chemistry Recent Labs  Lab 03/11/20 2035 03/12/20 0052 03/12/20 0404 03/12/20 0404 03/13/20 0537 03/13/20 0537 03/13/20 0558 03/14/20 0239 03/15/20 0307  NA 119*   < > 123*   < > 121*   < > 123* 127* 129*  K 5.8*   < > 4.3   < > 4.8   < > 4.1 3.9 4.6  CL 87*   < > 92*   < > 88*  --   --  92* 92*  CO2 22   < > 21*   < >  20*  --   --  22 26  GLUCOSE 220*   < > 196*   < > 337*  --   --  198* 82  BUN 21   < > 18   < > 13  --   --  18 26*  CREATININE 0.61   < > 0.50   < > 0.79  --   --  0.81 1.18*  CALCIUM 9.5   < > 9.3   < > 9.1  --   --  9.1 9.0  PROT 7.5  --  6.5  --  7.0  --   --   --   --   ALBUMIN 4.4  --  3.8  --  4.1  --   --   --   --   AST 20  --  18  --  52*  --   --   --   --   ALT 21  --  20  --  17  --   --   --   --   ALKPHOS 86  --  73  --  90  --   --   --   --   BILITOT 1.0  --  0.7  --  2.2*  --   --   --   --   GFRNONAA >60   < > >60   < > >60  --   --  >60 45*  GFRAA >60   < > >60   < > >60  --   --  >60 52*  ANIONGAP 10   < > 10   < > 13  --   --  13 11   < > = values in this interval not displayed.     Hematology Recent Labs  Lab 03/13/20 0537 03/13/20 0537 03/13/20 0558 03/14/20 0239 03/15/20 0307  WBC 14.0*  --   --  16.3* 10.7*  RBC 4.47  --   --  4.08 4.55  HGB 14.7   < > 15.3* 13.0 14.7  HCT 43.2   < > 45.0 38.2 43.7  MCV 96.6  --   --  93.6 96.0  MCH 32.9  --   --  31.9 32.3   MCHC 34.0  --   --  34.0 33.6  RDW 13.4  --   --  13.4 13.8  PLT 310  --   --  233 220   < > = values in this interval not displayed.    BNPNo results for input(s): BNP, PROBNP in the last 168 hours.   DDimer No results for input(s): DDIMER in the last 168 hours.   Radiology    DG Chest Port 1 View  Result Date: 03/14/2020 CLINICAL DATA:  Acute respiratory failure EXAM: PORTABLE CHEST 1 VIEW COMPARISON:  03/13/2020 and CTA chest, 03/13/2020. Chest radiographs 03/11/2020. FINDINGS: Cardiac silhouette normal in size. Hazy airspace opacity in the lungs, most apparent in the lower lungs. There is mild interstitial thickening bilaterally. More confluent opacity at the lung bases obscures hemidiaphragms, greater on the right, consistent with pleural effusions. These have increased from the prior exam. No pneumothorax. Endotracheal tube and nasal/orogastric tube are stable and well positioned. IMPRESSION: 1. Increased opacity at the lung bases consistent with enlarged pleural effusions. 2. Persistent interstitial thickening and hazy airspace opacities consistent with pulmonary edema. 3. Stable well-positioned support apparatus. Electronically Signed   By: Amie Portland M.D.   On: 03/14/2020 10:18  Patient Profile     74 y.o. female with subarachnoid hemorrhage COPD hyponatremia atrial fibrillation with rapid ventricular spots with moderate severe mitral stenosis  Assessment & Plan    A. fib with RVR -Continue amiodarone IV which was initiated on 03/13/2020.   -Continue with low-dose digoxin -Change Cardizem to IV for improved rate control, blood pressure tolerating -No anticoagulation because of intracranial bleed.  Mitral stenosis -We would like to see as best rate control as possible.  This will improve overall cardiac function. -Personally reviewed echocardiogram, marked mitral annular calcification, mean gradient 10 to 12 mmHg across mitral valve.  Interestingly, left atrium does not  appear to be significantly dilated. -Continue with IV Lasix.  Carefully monitoring creatinine.  Rate control helpful.  Discussed with Dr. Vassie Loll  Subarachnoid hemorrhage per primary team.      For questions or updates, please contact [facility] Please consult [email or web address] for contact info under        Signed, Donato Schultz, MD  03/15/2020, 9:18 AM

## 2020-03-15 NOTE — Progress Notes (Signed)
Patient woke-up restless and desat at low 70's. Patient had a secretions and had crackle lung sounds. RT did NT and oral suction. Patient also cough up pink tinged, tan thick secretions. RN did oral suctions. Placed the patient in NRB 15 lts and O2 sats went back to 94-100%. Will continue to monitor the patient.

## 2020-03-16 ENCOUNTER — Inpatient Hospital Stay (HOSPITAL_COMMUNITY): Payer: Medicare Other

## 2020-03-16 DIAGNOSIS — I050 Rheumatic mitral stenosis: Secondary | ICD-10-CM

## 2020-03-16 DIAGNOSIS — I5021 Acute systolic (congestive) heart failure: Secondary | ICD-10-CM

## 2020-03-16 DIAGNOSIS — I5031 Acute diastolic (congestive) heart failure: Secondary | ICD-10-CM

## 2020-03-16 DIAGNOSIS — Z515 Encounter for palliative care: Secondary | ICD-10-CM

## 2020-03-16 DIAGNOSIS — Z7189 Other specified counseling: Secondary | ICD-10-CM

## 2020-03-16 LAB — GLUCOSE, CAPILLARY
Glucose-Capillary: 194 mg/dL — ABNORMAL HIGH (ref 70–99)
Glucose-Capillary: 204 mg/dL — ABNORMAL HIGH (ref 70–99)
Glucose-Capillary: 250 mg/dL — ABNORMAL HIGH (ref 70–99)
Glucose-Capillary: 279 mg/dL — ABNORMAL HIGH (ref 70–99)
Glucose-Capillary: 325 mg/dL — ABNORMAL HIGH (ref 70–99)
Glucose-Capillary: 333 mg/dL — ABNORMAL HIGH (ref 70–99)

## 2020-03-16 LAB — CBC
HCT: 41.4 % (ref 36.0–46.0)
Hemoglobin: 13.6 g/dL (ref 12.0–15.0)
MCH: 31.8 pg (ref 26.0–34.0)
MCHC: 32.9 g/dL (ref 30.0–36.0)
MCV: 96.7 fL (ref 80.0–100.0)
Platelets: 196 10*3/uL (ref 150–400)
RBC: 4.28 MIL/uL (ref 3.87–5.11)
RDW: 14 % (ref 11.5–15.5)
WBC: 10.4 10*3/uL (ref 4.0–10.5)
nRBC: 0 % (ref 0.0–0.2)

## 2020-03-16 LAB — BASIC METABOLIC PANEL
Anion gap: 16 — ABNORMAL HIGH (ref 5–15)
BUN: 35 mg/dL — ABNORMAL HIGH (ref 8–23)
CO2: 23 mmol/L (ref 22–32)
Calcium: 9.2 mg/dL (ref 8.9–10.3)
Chloride: 91 mmol/L — ABNORMAL LOW (ref 98–111)
Creatinine, Ser: 0.99 mg/dL (ref 0.44–1.00)
GFR calc Af Amer: >60 mL/min (ref >60)
GFR calc non Af Amer: 56 mL/min — ABNORMAL LOW (ref >60)
Glucose, Bld: 210 mg/dL — ABNORMAL HIGH (ref 70–99)
Potassium: 4.1 mmol/L (ref 3.5–5.1)
Sodium: 130 mmol/L — ABNORMAL LOW (ref 135–145)

## 2020-03-16 MED ORDER — DOCUSATE SODIUM 100 MG PO CAPS
100.0000 mg | ORAL_CAPSULE | Freq: Two times a day (BID) | ORAL | Status: DC
  Administered 2020-03-16: 100 mg via ORAL
  Filled 2020-03-16: qty 1

## 2020-03-16 MED ORDER — OLANZAPINE 5 MG PO TBDP
2.5000 mg | ORAL_TABLET | Freq: Every day | ORAL | Status: DC
  Administered 2020-03-16: 2.5 mg via ORAL
  Filled 2020-03-16 (×2): qty 0.5

## 2020-03-16 MED ORDER — AMIODARONE IV BOLUS ONLY 150 MG/100ML
150.0000 mg | Freq: Once | INTRAVENOUS | Status: AC
  Administered 2020-03-16: 150 mg via INTRAVENOUS

## 2020-03-16 NOTE — Progress Notes (Signed)
  Speech Language Pathology Treatment: Dysphagia  Patient Details Name: Dana [name] MRN: [id number] DOB: 12/02/1945 Today's Date: 03/16/2020 Time: 8341-9622 SLP Time Calculation (min) (ACUTE ONLY): 35 min  Assessment / Plan / Recommendation Clinical Impression  Pt seen to assess readiness for instrumental swallow evaluation incorporating pt's desires. Pt today appears depressed repeatedly stating "Leave me alone" and "I want to die".  She was seen with trials of ice chips, water via tsp, cup, nectar thick and honey thick liquids, applesauce and graham cracker bolus.  Immediate cough noted post-swallow of thin and nectar thick liquids concerning for aspiration.  Daughter in law [name] present and reports pt has baseline dysphagia, cough associated with liquid intake.  Cough was congested but pt appeared able to clear large portion of secretions.  Decreasing bolus amount to tsp mitigated cough with intake.  PO of applesauce/honey thick liquids resulted in no overt indication of airway compromise.     Depending on pt's goals of care - if goal is treatment - MBS indicated however if pt desires "comfort care" - diet of choice would be advised.  No bowel movement has been documented since admission-== would prefer pt have bowel movement prior to MBS.    Pt's dysphagia may be severe and modification of diet may only mitigate risk.  Pt enjoys water therefore - even if modified diet advised, would highly recommend pt consume thin water between meals after mouth care.   SLP assisted with medication administration with RN dispensing medicine including Miralax *RN to get Colace gel pill for pt from pharmacy. No indication of aspiration with po consumption.    Pending definitive plans, recommend MEDS with APPLESAUCE, PO TSPS WATER and SINGLE ICE CHIPS.  Encouraged pt to continue to cough and expectorate as able to clear secretions.  Educated pt and [name] to recommendations using teach back and  demonstration.  SLP awaits GOC to determine MBS need. Thanks. RN, pt and family aware.      HPI HPI: 74 year old female with PMH as below, which is significant for DM, COPD, HTN, HLD. She has a recent history of neurological decline with concern for dementia. She had been suffering multiple falls at home of late as well. CT revealed R frontal SAH.  CXR 7/31: "1. Increased opacity at the lung bases consistent with enlargedpleural effusions.2. Persistent interstitial thickening and hazy airspace opacitiesconsistent with pulmonary edema.3. Stable well-positioned support apparatus."  Pt intubated ~24 hours, extubated 9 am 7/31      SLP Plan  Continue with current plan of care       Recommendations  Diet recommendations: NPO (ice chips, tsp water and medications with puree) Medication Administration: Via alternative means Supervision: Patient able to self feed Compensations: Slow rate;Small sips/bites;Other (Comment) (cough and expectorte prn) Postural Changes and/or Swallow Maneuvers: Upright 30-60 min after meal;Seated upright 90 degrees                Oral Care Recommendations: Oral care QID Follow up Recommendations: Home health SLP SLP Visit Diagnosis: Dysphagia, oropharyngeal phase (R13.12) Plan: Continue with current plan of care       GO                Chales Abrahams 03/16/2020, 8:46 AM  Rolena Infante, MS [facility] SLP Acute Rehab Services Office [phone]

## 2020-03-16 NOTE — Consult Note (Signed)
Consultation Note Date: 03/16/2020   Patient Name: Dana [name]  DOB: Jul 16, 1946  MRN: [id number]  Age / Sex: 74 y.o., female  PCP: [place], Betty G, MD Referring Physician: Lupita Leash, MD  Reason for Consultation: Establishing goals of care  HPI/Patient Profile: 74 y.o. female  with past medical history of hypertension, hyperlipidemia, CKD, T2DM, COPD admitted on 03/11/2020 with recurrent falls and found to be in new onset atrial fibrillation with RVR and hyponatremic (121). Head imaging on admission revealed a small frontal subarachnoid hemorrhage. She was started on a diltazem drip and admitted to [facility]. On the morning of 03/13/20, she developed progressive respiratory failure for which CTA was obtained which showed pulmonary edema and evidence of increased right heart pressures. Respiratory failure progressed to the point of requiring intubation and she was transferred to [facility]. She was extubated 7/31. Since extubation, she has began refusing medical therapy. She is not eating or taking medications. Tells staff to leave her alone and that she wants to die.   Clinical Assessment and Goals of Care: Evaluated patient. She would not engage in discussion. She laid in bed and told me to leave her alone, she wants to die. She asked me to turn everything off, pointing to her pumps and to her monitoring. I could not assess her orientation. When I attempted to engage in her discussion- attempting to ask her why she wants to die, if she had anyone she wanted to see before she dies, asking if she wanted to discuss how she died or where- she simply told me that she just wanted to be left alone- this is consistent with reports received from medical team. She cannot tell me where she is or why she is in the hospital.  I called her daughter in law- Dana [name]- Dana [name] tells me that she has been depressed her whole life.  She has been on different medications, was in therapy- but her therapist stopped seeing her because she would not participate in therapy. Dana [name] notes dramatic decline in the last year. Dana [name] has stopped eating, has lost weight. Has had several small falls leading up to this most recent fall for which she is hospitalized.  Patient's son Dana [name] is patient's primary Management consultant. I also called and discussed with Dana [name].  Dana [name] and Dana [name] are faced with decision making regarding anticipatory care needs and considering ongoing aggressive medical care vs transition to more comfort focused care and possible Hospice. There is some worry due to patient's history of depression and her constant statement of her desire to die. Family wonders if patient is truly at end of life- or if this is her normal state. Dana [name] feels this is different due to patient in past would state she wanted to die, however, would continue to participate in care, would eventually eat, take meds.   Primary Decision Maker NEXT OF KIN- son- Dana [name]    SUMMARY OF RECOMMENDATIONS -Continue current care -Plan made to meet with patient's son tomorrow morning at 0900 -If patient will take-  will trial low dose oral olanzapine for possible depression with delirious features    Code Status/Advance Care Planning:  DNR  Prognosis:    Unable to determine  Discharge Planning: To Be Determined  Primary Diagnoses: Present on Admission: . Type 2 diabetes mellitus with diabetic neuropathy, without long-term current use of insulin (HCC) . Major depressive disorder in remission (HCC) . Fibromyalgia . Tobacco use disorder . Hyponatremia . Iron deficiency anemia   I have reviewed the medical record, interviewed the patient and family, and examined the patient. The following aspects are pertinent.  Past Medical History:  Diagnosis Date  . Asthma   . Depression   . Diabetes mellitus without complication (HCC)   . Emphysema of  lung (HCC)   . Heart disease   . Hyperlipidemia   . Hypertension   . Irritable bowel syndrome     Family History  Problem Relation Age of Onset  . Heart disease Mother   . Hypertension Mother   . Heart attack Father   . Heart disease Father   . Hyperlipidemia Father   . Hypertension Father   . Hypertension Sister   . Cancer Brother   . Depression Son   . Heart attack Son   . Heart disease Son   . Hyperlipidemia Son   . Hypertension Son   . Kidney disease Son   . Learning disabilities Son   . Mental illness Son   . Heart disease Sister   . Heart attack Sister   . Heart disease Sister   . Heart disease Brother   . Heart attack Brother   . Mental illness Brother   . Heart disease Brother   . Mental illness Brother    Scheduled Meds: . Chlorhexidine Gluconate Cloth  6 each Topical Daily  . digoxin  0.0625 mg Oral Daily  . docusate sodium  100 mg Oral BID  . guaiFENesin  600 mg Oral BID  . insulin aspart  0-9 Units Subcutaneous Q4H  . ipratropium  0.5 mg Nebulization Q6H  . iron polysaccharides  150 mg Oral Daily  . mouth rinse  15 mL Mouth Rinse q12n4p  . methylPREDNISolone (SOLU-MEDROL) injection  40 mg Intravenous Q8H  . Milnacipran  50 mg Oral BID  . mirtazapine  30 mg Oral QHS  . montelukast  10 mg Oral QHS  .  morphine injection  2 mg Intravenous Once  . nicotine  21 mg Transdermal Daily  . polyethylene glycol  17 g Oral Daily  . pravastatin  40 mg Oral q1800  . sodium chloride HYPERTONIC  4 mL Nebulization BID   Continuous Infusions: . amiodarone 30 mg/hr (03/16/20 1201)  . cefTRIAXone (ROCEPHIN)  IV Stopped (03/15/20 1609)  . diltiazem (CARDIZEM) infusion 15 mg/hr (03/16/20 1149)   PRN Meds:.levalbuterol, midazolam, midazolam Medications Prior to Admission:  Prior to Admission medications   Medication Sig Start Date End Date Taking? Authorizing Provider  aspirin 81 MG tablet Take 81 mg by mouth daily.   Yes [provider]  calcium  citrate-vitamin D 500-400 MG-UNIT chewable tablet Chew 1 tablet by mouth daily.   Yes [provider]  cetirizine (ZYRTEC) 10 MG tablet Take 1 tablet (10 mg total) by mouth daily. 07/02/18  Yes [place], Betty G, MD  Cranberry 500 MG CAPS Take 500 mg by mouth daily.   Yes [provider]  desloratadine (CLARINEX) 5 MG tablet TAKE 1 TABLET DAILY Patient taking differently: Take 5 mg by mouth daily.  12/30/19  Yes [place], Betty G, MD  iron polysaccharides (NIFEREX) 150 MG capsule Take 150 mg by mouth daily.   Yes [provider]  JANUVIA 50 MG tablet TAKE 1 TABLET DAILY Patient taking differently: Take 50 mg by mouth daily.  11/13/19  Yes [place], Betty G, MD  LIVALO 2 MG TABS TAKE 1 TABLET DAILY Patient taking differently: Take 2 mg by mouth daily.  12/31/19  Yes [place], Betty G, MD  losartan (COZAAR) 25 MG tablet TAKE 1 TABLET DAILY Patient taking differently: Take 25 mg by mouth daily.  12/10/19  Yes [place], Betty G, MD  metFORMIN (GLUCOPHAGE) 1000 MG tablet TAKE 1 TABLET TWICE A DAY WITH MEALS Patient taking differently: Take 1,000 mg by mouth 2 (two) times daily with a meal.  09/27/19  Yes [place], Betty G, MD  metoprolol succinate (TOPROL-XL) 50 MG 24 hr tablet TAKE 1 TABLET DAILY WITH OR IMMEDIATELY FOLLOWING A MEAL Patient taking differently: Take 50 mg by mouth daily.  12/23/19  Yes [place], Betty G, MD  mirtazapine (REMERON) 30 MG tablet TAKE 1 TABLET AT BEDTIME Patient taking differently: Take 30 mg by mouth at bedtime.  06/28/19  Yes [place], Betty G, MD  montelukast (SINGULAIR) 10 MG tablet TAKE 1 TABLET AT BEDTIME Patient taking differently: Take 10 mg by mouth at bedtime.  12/30/19  Yes [place], Betty G, MD  SAVELLA 50 MG TABS tablet TAKE 1 TABLET EVERY 12 HOURS Patient taking differently: Take 50 mg by mouth 2 (two) times daily.  06/28/19  Yes [place], Betty G, MD  vitamin C (ASCORBIC ACID) 500 MG tablet Take 500 mg by mouth daily.   Yes [provider]    vitamin E (VITAMIN E) 400 UNIT capsule Take 400 Units by mouth daily.   Yes [provider]   Allergies  Allergen Reactions  . Bactrim [Sulfamethoxazole-Trimethoprim] Hives   Review of Systems  Unable to perform ROS: Mental status change    Physical Exam Vitals and nursing note reviewed.  Constitutional:      Appearance: She is ill-appearing.     Comments: Frail, cachetic  Neurological:     Mental Status: She is disoriented.     Vital Signs: BP 134/71   Pulse 99   Temp 98.1 F (36.7 C) (Oral)   Resp (!) 29   Ht 5\' 1"  (1.549 m)   Wt 56.9 kg   SpO2 97%   BMI 23.70 kg/m  Pain Scale: 0-10   Pain Score: Asleep   SpO2: SpO2: 97 % O2 Device:SpO2: 97 % O2 Flow Rate: .O2 Flow Rate (L/min): 4 L/min  IO: Intake/output summary:   Intake/Output Summary (Last 24 hours) at 03/16/2020 1329 Last data filed at 03/16/2020 1200 Gross per 24 hour  Intake 771.83 ml  Output 450 ml  Net 321.83 ml    LBM: Last BM Date:  (PTA) Baseline Weight: Weight: 56.9 kg Most recent weight: Weight: 56.9 kg     Palliative Assessment/Data: PPS: 20%     Thank you for this consult. Palliative medicine will continue to follow and assist as needed.   Time In: 1400 Time Out: 1515 Time Total: 75 minutes Greater than 50%  of this time was spent counseling and coordinating care related to the above assessment and plan.  Signed by: 05/16/2020, AGNP-C Palliative Medicine    Please contact Palliative Medicine Team phone at [phone] for questions and concerns.  For individual provider: See [phone]

## 2020-03-16 NOTE — Progress Notes (Signed)
Progress Note  Patient Name: Dana [name] Date of Encounter: 03/16/2020  [facility] Cardiologist: Thurmon Fair, MD   Subjective   alert no chest pain  Inpatient Medications    Scheduled Meds: . Chlorhexidine Gluconate Cloth  6 each Topical Daily  . digoxin  0.0625 mg Oral Daily  . docusate sodium  100 mg Oral BID  . guaiFENesin  600 mg Oral BID  . insulin aspart  0-9 Units Subcutaneous Q4H  . ipratropium  0.5 mg Nebulization Q6H  . iron polysaccharides  150 mg Oral Daily  . mouth rinse  15 mL Mouth Rinse q12n4p  . methylPREDNISolone (SOLU-MEDROL) injection  40 mg Intravenous Q8H  . Milnacipran  50 mg Oral BID  . mirtazapine  30 mg Oral QHS  . montelukast  10 mg Oral QHS  .  morphine injection  2 mg Intravenous Once  . nicotine  21 mg Transdermal Daily  . polyethylene glycol  17 g Oral Daily  . pravastatin  40 mg Oral q1800  . sodium chloride HYPERTONIC  4 mL Nebulization BID   Continuous Infusions: . amiodarone 30 mg/hr (03/16/20 1030)  . amiodarone    . cefTRIAXone (ROCEPHIN)  IV Stopped (03/15/20 1609)  . diltiazem (CARDIZEM) infusion 10 mg/hr (03/16/20 1037)   PRN Meds: levalbuterol, midazolam, midazolam   Vital Signs    Vitals:   03/16/20 0800 03/16/20 0859 03/16/20 0900 03/16/20 1000  BP: (!) 148/74  (!) 129/64 127/67  Pulse: 64   92  Resp: 19  15 12   Temp: 98.1 F (36.7 C)     TempSrc: Oral     SpO2: 98% 98%  99%  Weight:      Height:        Intake/Output Summary (Last 24 hours) at 03/16/2020 1059 Last data filed at 03/16/2020 1030 Gross per 24 hour  Intake 936.2 ml  Output 450 ml  Net 486.2 ml   Last 3 Weights 03/11/2020 12/20/2019 04/02/2019  Weight (lbs) 125 lb 7.1 oz 125 lb 6 oz 122 lb 12.8 oz  Weight (kg) 56.9 kg 56.87 kg 55.702 kg      Telemetry    Atrial fib rapid at times  - Personally Reviewed  ECG    No new - Personally Reviewed  Physical Exam  Per Dr. 04/04/2019     Labs    High Sensitivity Troponin:   Recent  Labs  Lab 03/12/20 0052 03/12/20 0404  TROPONINIHS 13 12      Chemistry Recent Labs  Lab 03/11/20 2035 03/12/20 0052 03/12/20 0404 03/12/20 0404 03/13/20 0537 03/13/20 0558 03/14/20 0239 03/15/20 0307 03/16/20 0329  NA 119*   < > 123*   < > 121*   < > 127* 129* 130*  K 5.8*   < > 4.3   < > 4.8   < > 3.9 4.6 4.1  CL 87*   < > 92*   < > 88*   < > 92* 92* 91*  CO2 22   < > 21*   < > 20*   < > 22 26 23   GLUCOSE 220*   < > 196*   < > 337*   < > 198* 82 210*  BUN 21   < > 18   < > 13   < > 18 26* 35*  CREATININE 0.61   < > 0.50   < > 0.79   < > 0.81 1.18* 0.99  CALCIUM 9.5   < > 9.3   < >  9.1   < > 9.1 9.0 9.2  PROT 7.5  --  6.5  --  7.0  --   --   --   --   ALBUMIN 4.4  --  3.8  --  4.1  --   --   --   --   AST 20  --  18  --  52*  --   --   --   --   ALT 21  --  20  --  17  --   --   --   --   ALKPHOS 86  --  73  --  90  --   --   --   --   BILITOT 1.0  --  0.7  --  2.2*  --   --   --   --   GFRNONAA >60   < > >60   < > >60   < > >60 45* 56*  GFRAA >60   < > >60   < > >60   < > >60 52* >60  ANIONGAP 10   < > 10   < > 13   < > 13 11 16*   < > = values in this interval not displayed.     Hematology Recent Labs  Lab 03/14/20 0239 03/15/20 0307 03/16/20 0329  WBC 16.3* 10.7* 10.4  RBC 4.08 4.55 4.28  HGB 13.0 14.7 13.6  HCT 38.2 43.7 41.4  MCV 93.6 96.0 96.7  MCH 31.9 32.3 31.8  MCHC 34.0 33.6 32.9  RDW 13.4 13.8 14.0  PLT 233 220 196    BNPNo results for input(s): BNP, PROBNP in the last 168 hours.   DDimer No results for input(s): DDIMER in the last 168 hours.   Radiology    DG Chest Port 1 View  Result Date: 03/16/2020 CLINICAL DATA:  Acute respiratory failure EXAM: PORTABLE CHEST 1 VIEW COMPARISON:  03/14/2020 FINDINGS: Borderline heart size. Diffuse airspace disease throughout both lungs. Probable small effusions. Changes likely due to edema although diffuse pneumonia could have this appearance. Appearances are probably similar to prior study, lying for  differences in technique. The endotracheal and enteric tubes were removed. IMPRESSION: Diffuse bilateral airspace disease with probable small effusions. Electronically Signed   By: Burman Nieves M.D.   On: 03/16/2020 06:39    Cardiac Studies   Echo 03/12/20  IMPRESSIONS    1. Left ventricular ejection fraction, by estimation, is 50 to 55%. The  left ventricle has low normal function. The left ventricle has no regional  wall motion abnormalities. There is moderate asymmetric left ventricular  hypertrophy of the basal-septal  segment. Left ventricular diastolic parameters are indeterminate.  2. Right ventricular systolic function is mildly reduced. The right  ventricular size is normal. There is mildly elevated pulmonary artery  systolic pressure. The estimated right ventricular systolic pressure is  35.3 mmHg.  3. The mitral valve is degenerative. Severe mitral annular calcification.  Trivial mitral valve regurgitation. Severe mitral stenosis. MG 12 mmHg at  HR 105 bpm, MVA 0.7cm^2 by continuity equation. Gradients increased due to  Afib with RVR  4. The aortic valve is tricuspid. Aortic valve regurgitation is not  visualized. Mild to moderate aortic valve sclerosis/calcification is  present, without any evidence of aortic stenosis.  5. The inferior vena cava is normal in size with greater than 50%  respiratory variability, suggesting right atrial pressure of 3 mmHg.   FINDINGS  Left Ventricle: Left ventricular ejection  fraction, by estimation, is 50  to 55%. The left ventricle has low normal function. The left ventricle has  no regional wall motion abnormalities. The left ventricular internal  cavity size was small. There is  moderate asymmetric left ventricular hypertrophy of the basal-septal  segment. Left ventricular diastolic parameters are indeterminate.   Right Ventricle: The right ventricular size is normal. Right vetricular  wall thickness was not assessed. Right  ventricular systolic function is  mildly reduced. There is mildly elevated pulmonary artery systolic  pressure. The tricuspid regurgitant  velocity is 2.84 m/s, and with an assumed right atrial pressure of 3 mmHg,  the estimated right ventricular systolic pressure is 35.3 mmHg.   Left Atrium: Left atrial size was normal in size.   Right Atrium: Right atrial size was normal in size.   Pericardium: There is no evidence of pericardial effusion.   Mitral Valve: The mitral valve is degenerative in appearance. Severe  mitral annular calcification. Trivial mitral valve regurgitation. Severe  mitral valve stenosis. MV peak gradient, 24.3 mmHg. The mean mitral valve  gradient is 11.0 mmHg.   Tricuspid Valve: The tricuspid valve is normal in structure. Tricuspid  valve regurgitation is trivial.   Aortic Valve: The aortic valve is tricuspid. Aortic valve regurgitation is  not visualized. Mild to moderate aortic valve sclerosis/calcification is  present, without any evidence of aortic stenosis.   Pulmonic Valve: The pulmonic valve was not well visualized. Pulmonic valve  regurgitation is not visualized.   Aorta: The aortic root and ascending aorta are structurally normal, with  no evidence of dilitation.   Venous: The inferior vena cava is normal in size with greater than 50%  respiratory variability, suggesting right atrial pressure of 3 mmHg.   IAS/Shunts: No atrial level shunt detected by color flow Doppler.   Patient Profile     74 y.o. female with subarachnoid hemorrhage COPD hyponatremia atrial fibrillation with rapid ventricular spots with moderate severe mitral stenosis  Assessment & Plan    A. fib with RVR -Continue amiodarone IV which was initiated on 03/13/2020.  with elevated HR add IV amiodarone bolus 150 -Continue with low-dose digoxin -Change Cardizem to IV for improved rate control, blood pressure tolerating and are increasing to 15 -No anticoagulation because of  intracranial bleed.  Mitral stenosis -We would like to see as best rate control as possible.  This will improve overall cardiac function. -Personally reviewed echocardiogram, marked mitral annular calcification, mean gradient 10 to 12 mmHg across mitral valve.  Interestingly, left atrium does not appear to be significantly dilated. -Continue with IV Lasix.  Carefully monitoring creatinine.  Rate control helpful.  Dr. Anne Fu did discuss with Dr. Vassie Loll  Subarachnoid hemorrhage per primary team.          For questions or updates, please contact [facility] Please consult [email or web address] for contact info under        Signed, Nada Boozer, NP  03/16/2020, 10:59 AM

## 2020-03-16 NOTE — Progress Notes (Signed)
PT Cancellation Note  Patient Details Name: Dana [name] MRN: [id number] DOB: [birth year]   Cancelled Treatment:    Reason Eval/Treat Not Completed: Medical issues which prohibited therapy, will check back another time.    Rada Hay 03/16/2020, 4:45 PM  Blanchard Kelch PT Acute Rehabilitation Services Pager [phone] Office [phone]

## 2020-03-16 NOTE — Progress Notes (Signed)
Patient have not voided since foley was removed at 17:00, bladder scan reveals 278 ml. Paged and approved order of in & out straight cath by Sharyne Peach MD. Output was amber clear colored urine.

## 2020-03-16 NOTE — Progress Notes (Signed)
NAME:  Dana [name], MRN:  [id number], DOB:  06/04/1946, LOS: 4 ADMISSION DATE:  03/11/2020, CONSULTATION DATE:  7/30 REFERRING MD:  Dr. Marland Mcalpine, CHIEF COMPLAINT:  Hypoxia   Brief History   74 year old female admitted after fall and found to have SAH and new onset AF/RVR. 7/30 AM she developed respiratory distress requiring intubation prompting PCCM consultation.   History of present illness   74 year old female with PMH as below, which is significant for DM, COPD, HTN, HLD. She has a recent history of neurological decline with concern for dementia. She had been suffering multiple falls at home of late as well. She lives by herself. She fell approximately 7/24 with impact to head and a facial laceration. She did not present at that time, but on 7/28 she was observed falling onto couch and was brought to [facility] ED. Upon arrival to the ED she was found to be in atrial fibrillation, which is a new diagnosis. She was started on diltiazem, which was stopped due to hypotension. CT of the head demonstrated small frontal SAH. Lab eval significant for Na 121, down from baseline of about 127. She was admitted to the hospitalists service with cardiology consultation and was started on diltiazem. In the morning hours of 7/30, she developed respiratory distress prompting CTA of the chest, which was concerning for volume overload. Upon arriving from CT, she became profoundly hypoxic with O2 sats in the 70s. Her distress continued despite 15L NRB and she was intubated by anesthesia. PCCM consulted.   Past Medical History   has a past medical history of Asthma, Depression, Diabetes mellitus without complication (HCC), Emphysema of lung (HCC), Heart disease, Hyperlipidemia, Hypertension, and Irritable bowel syndrome.  Significant Hospital Events   7/28 admit 7/30 respiratory decompensation resulting in intubation. LCB issued based on d/w son/ living will 7/31 extubated 8/2 refusing care palliative  consulted  Consults:  Cardiology PCCM  Procedures:  ETT 7/30 >7/31  Significant Diagnostic Tests:  CT head 7/28 > Small right frontal convexity subarachnoid hemorrhage. CT head 7/29 > Stable small volume sulcal subarachnoid hemorrhage. Echo 7/29 > LVEF 50-55%, no regional wall motion abnormalities. Severe mitral stenosis CTA chest 7/30 > No demonstrable pulmonary embolus. Bilateral pleural effusions with diffuse interstitial pulmonary edema. Areas of patchy airspace opacity likely represent patchy alveolar edema.  Micro Data:  resp 7/30 >>rare GPC >>  Antimicrobials:  ceftx 7/31 >>  Interim history/subjective:   Appears comfortable    Objective   Blood pressure 127/67, pulse 92, temperature 98.1 F (36.7 C), temperature source Oral, resp. rate 12, height 5\' 1"  (1.549 m), weight 56.9 kg, SpO2 99 %.        Intake/Output Summary (Last 24 hours) at 03/16/2020 1147 Last data filed at 03/16/2020 1030 Gross per 24 hour  Intake 820.08 ml  Output 450 ml  Net 370.08 ml   Filed Weights   03/11/20 1849  Weight: 56.9 kg    Examination: General this is a very pleasant 74 year old white female currently resting on nasal cannula support HEENT normocephalic atraumatic some areas of facial bruising this is slowly clearing Pulmonary coarse scattered rhonchi no accessory use resting comfortably currently 4 L/min Cardiac regular irregular atrial fibrillation Abdomen soft nontender Extremities are warm and dry Neuro awake follows commands moves all extremities GU is having some urinary retention  Resolved Hospital Problem list     Assessment & Plan:  Extubated but still struggling with hypoxia and bronchospasm, unable to diurese due to  rising creatinine and pulmonary edema mostly driven by RVR in the setting of mitral stenosis   Acute hypoxemic/hypercarbic respiratory failure secondary to pulmonary edema and bilateral pleural effusions and AECOPD. Can't exclude element of aspiration   Smoker - extubated 7/31 -refusing therapies but appears comfortable on 4 lpm Plan Cont Oxygen NPO Palliative consult Cont BDs scheduled Cont systemic solumedrol  PRN lasix  Aspiration precautions Day 3/ 5 ctx   Aspiration risk Plan Holding off on MBS pending goals of care    Subarachnoid hemorrhage: small frontal bleed, likely traumatic - No surgical intervention.  Plan Supportive care   Atrial fibrillation with RVR, new onset.  Moderate -severe MS -rate better controlled.  Plan Rate control as directed by cardiology No AC    AKI -developed increased creatinine with Lasix, hold for 24 hours-->now better Plan Trend cmp Hold lasix for now   Hyponatremia: acute on chronic-->stable  Plan Monitor PRN   Uncontrolled hyperglycemia, due to steroids Plan Cont ssi   Goals of care: pending palliative consult We will have IM take over and follow as pulm.    Best practice:  Diet: NPO Pain/Anxiety/Delirium protocol (if indicated): Per Protocol VAP protocol (if indicated): Per protocol DVT prophylaxis: SCDs GI prophylaxis: PPI Glucose control: SSI Mobility: BR Code Status: DNR  Family Communication: Son  at bedside Disposition: ICU, changed to stepdown   Critical care time: 31 min

## 2020-03-17 DIAGNOSIS — Z66 Do not resuscitate: Secondary | ICD-10-CM

## 2020-03-17 DIAGNOSIS — Z515 Encounter for palliative care: Secondary | ICD-10-CM

## 2020-03-17 LAB — GLUCOSE, CAPILLARY
Glucose-Capillary: 251 mg/dL — ABNORMAL HIGH (ref 70–99)
Glucose-Capillary: 251 mg/dL — ABNORMAL HIGH (ref 70–99)
Glucose-Capillary: 257 mg/dL — ABNORMAL HIGH (ref 70–99)
Glucose-Capillary: 257 mg/dL — ABNORMAL HIGH (ref 70–99)

## 2020-03-17 MED ORDER — MORPHINE SULFATE (CONCENTRATE) 10 MG/0.5ML PO SOLN
5.0000 mg | ORAL | Status: DC | PRN
  Administered 2020-03-17 – 2020-03-18 (×3): 5 mg via ORAL
  Filled 2020-03-17 (×3): qty 0.5

## 2020-03-17 MED ORDER — GLYCOPYRROLATE 0.2 MG/ML IJ SOLN: 0.2000 mg | INTRAMUSCULAR | Status: DC | PRN

## 2020-03-17 MED ORDER — HALOPERIDOL LACTATE 2 MG/ML PO CONC
0.5000 mg | ORAL | Status: DC | PRN
  Filled 2020-03-17: qty 0.3

## 2020-03-17 MED ORDER — OLANZAPINE 5 MG PO TBDP: 2.5000 mg | ORAL_TABLET | Freq: Every day | ORAL | Status: AC

## 2020-03-17 MED ORDER — ACETAMINOPHEN 325 MG PO TABS: 650.0000 mg | ORAL_TABLET | Freq: Four times a day (QID) | ORAL | Status: AC | PRN

## 2020-03-17 MED ORDER — HALOPERIDOL LACTATE 5 MG/ML IJ SOLN: 0.5000 mg | INTRAMUSCULAR | Status: DC | PRN

## 2020-03-17 MED ORDER — GLYCOPYRROLATE 1 MG PO TABS: 1.0000 mg | ORAL_TABLET | ORAL | Status: AC | PRN

## 2020-03-17 MED ORDER — ACETAMINOPHEN 650 MG RE SUPP: 650.0000 mg | Freq: Four times a day (QID) | RECTAL | Status: DC | PRN

## 2020-03-17 MED ORDER — ACETAMINOPHEN 325 MG PO TABS: 650.0000 mg | ORAL_TABLET | Freq: Four times a day (QID) | ORAL | Status: DC | PRN

## 2020-03-17 MED ORDER — LORAZEPAM 1 MG PO TABS: 1.0000 mg | ORAL_TABLET | ORAL | Status: DC | PRN

## 2020-03-17 MED ORDER — LORAZEPAM 1 MG PO TABS: 1.0000 mg | ORAL_TABLET | ORAL | 0 refills | Status: AC | PRN

## 2020-03-17 MED ORDER — GLYCOPYRROLATE 1 MG PO TABS: 1.0000 mg | ORAL_TABLET | ORAL | Status: DC | PRN

## 2020-03-17 MED ORDER — ONDANSETRON 4 MG PO TBDP: 4.0000 mg | ORAL_TABLET | Freq: Four times a day (QID) | ORAL | 0 refills | Status: AC | PRN

## 2020-03-17 MED ORDER — POLYETHYLENE GLYCOL 3350 17 G PO PACK: 17.0000 g | PACK | Freq: Every day | ORAL | 0 refills | Status: AC

## 2020-03-17 MED ORDER — HALOPERIDOL 0.5 MG PO TABS: 0.5000 mg | ORAL_TABLET | ORAL | Status: AC | PRN

## 2020-03-17 MED ORDER — DOCUSATE SODIUM 100 MG PO CAPS: 100.0000 mg | ORAL_CAPSULE | Freq: Two times a day (BID) | ORAL | 0 refills | Status: AC

## 2020-03-17 MED ORDER — POLYVINYL ALCOHOL 1.4 % OP SOLN
1.0000 [drp] | Freq: Four times a day (QID) | OPHTHALMIC | Status: DC | PRN
  Filled 2020-03-17: qty 15

## 2020-03-17 MED ORDER — LORAZEPAM 2 MG/ML PO CONC: 1.0000 mg | ORAL | Status: DC | PRN

## 2020-03-17 MED ORDER — ONDANSETRON HCL 4 MG/2ML IJ SOLN: 4.0000 mg | Freq: Four times a day (QID) | INTRAMUSCULAR | Status: DC | PRN

## 2020-03-17 MED ORDER — BIOTENE DRY MOUTH MT LIQD: 15.0000 mL | OROMUCOSAL | Status: AC | PRN

## 2020-03-17 MED ORDER — NICOTINE 21 MG/24HR TD PT24: 21.0000 mg | MEDICATED_PATCH | Freq: Every day | TRANSDERMAL | 0 refills | Status: AC

## 2020-03-17 MED ORDER — BIOTENE DRY MOUTH MT LIQD: 15.0000 mL | OROMUCOSAL | Status: DC | PRN

## 2020-03-17 MED ORDER — LORAZEPAM 2 MG/ML IJ SOLN
1.0000 mg | INTRAMUSCULAR | Status: DC | PRN
  Administered 2020-03-17 – 2020-03-18 (×2): 1 mg via INTRAVENOUS
  Filled 2020-03-17 (×2): qty 1

## 2020-03-17 MED ORDER — POLYVINYL ALCOHOL 1.4 % OP SOLN: 1.0000 [drp] | Freq: Four times a day (QID) | OPHTHALMIC | 0 refills | Status: AC | PRN

## 2020-03-17 MED ORDER — MORPHINE SULFATE (CONCENTRATE) 10 MG/0.5ML PO SOLN: 5.0000 mg | ORAL | Status: AC | PRN

## 2020-03-17 MED ORDER — GUAIFENESIN ER 600 MG PO TB12: 600.0000 mg | ORAL_TABLET | Freq: Two times a day (BID) | ORAL | Status: AC

## 2020-03-17 MED ORDER — HALOPERIDOL 1 MG PO TABS: 0.5000 mg | ORAL_TABLET | ORAL | Status: DC | PRN

## 2020-03-17 MED ORDER — DIPHENHYDRAMINE HCL 50 MG/ML IJ SOLN: 12.5000 mg | INTRAMUSCULAR | Status: DC | PRN

## 2020-03-17 MED ORDER — MORPHINE SULFATE (CONCENTRATE) 10 MG/0.5ML PO SOLN: 5.0000 mg | ORAL | Status: DC | PRN

## 2020-03-17 MED ORDER — ONDANSETRON 4 MG PO TBDP: 4.0000 mg | ORAL_TABLET | Freq: Four times a day (QID) | ORAL | Status: DC | PRN

## 2020-03-17 NOTE — Progress Notes (Signed)
Progress Note  Patient Name: Dana [name] Date of Encounter: 03/17/2020  [facility] Cardiologist: Thurmon Fair, MD   Subjective   Pt resting Her son is in room now comfort care, DNR.    Inpatient Medications    Scheduled Meds: . Chlorhexidine Gluconate Cloth  6 each Topical Daily  . docusate sodium  100 mg Oral BID  . guaiFENesin  600 mg Oral BID  . iron polysaccharides  150 mg Oral Daily  . mouth rinse  15 mL Mouth Rinse q12n4p  . Milnacipran  50 mg Oral BID  . mirtazapine  30 mg Oral QHS  . nicotine  21 mg Transdermal Daily  . OLANZapine zydis  2.5 mg Oral QHS  . polyethylene glycol  17 g Oral Daily  . sodium chloride HYPERTONIC  4 mL Nebulization BID   Continuous Infusions:  PRN Meds: midazolam, midazolam   Vital Signs    Vitals:   03/17/20 0500 03/17/20 0748 03/17/20 0800 03/17/20 1000  BP:   136/76 (!) 145/60  Pulse: 91  (!) 106 90  Resp: 13  13 13   Temp:  97.9 F (36.6 C)    TempSrc:  Axillary    SpO2: 95%  91% 92%  Weight:      Height:        Intake/Output Summary (Last 24 hours) at 03/17/2020 1120 Last data filed at 03/17/2020 0747 Gross per 24 hour  Intake 915.62 ml  Output 615 ml  Net 300.62 ml   Last 3 Weights 03/11/2020 12/20/2019 04/02/2019  Weight (lbs) 125 lb 7.1 oz 125 lb 6 oz 122 lb 12.8 oz  Weight (kg) 56.9 kg 56.87 kg 55.702 kg      Telemetry    Atrial fib  - Personally Reviewed  ECG    No new - Personally Reviewed  Physical Exam   GEN: No acute distress.    Cardiac: irreg  2/6 murmur, no rubs, or gallops.  Respiratory: wheezes ant.  to auscultation bilaterally.      Labs    High Sensitivity Troponin:   Recent Labs  Lab 03/12/20 0052 03/12/20 0404  TROPONINIHS 13 12      Chemistry Recent Labs  Lab 03/11/20 2035 03/12/20 0052 03/12/20 0404 03/12/20 0404 03/13/20 0537 03/13/20 0558 03/14/20 0239 03/15/20 0307 03/16/20 0329  NA 119*   < > 123*   < > 121*   < > 127* 129* 130*  K 5.8*   < > 4.3   <  > 4.8   < > 3.9 4.6 4.1  CL 87*   < > 92*   < > 88*   < > 92* 92* 91*  CO2 22   < > 21*   < > 20*   < > 22 26 23   GLUCOSE 220*   < > 196*   < > 337*   < > 198* 82 210*  BUN 21   < > 18   < > 13   < > 18 26* 35*  CREATININE 0.61   < > 0.50   < > 0.79   < > 0.81 1.18* 0.99  CALCIUM 9.5   < > 9.3   < > 9.1   < > 9.1 9.0 9.2  PROT 7.5  --  6.5  --  7.0  --   --   --   --   ALBUMIN 4.4  --  3.8  --  4.1  --   --   --   --  AST 20  --  18  --  52*  --   --   --   --   ALT 21  --  20  --  17  --   --   --   --   ALKPHOS 86  --  73  --  90  --   --   --   --   BILITOT 1.0  --  0.7  --  2.2*  --   --   --   --   GFRNONAA >60   < > >60   < > >60   < > >60 45* 56*  GFRAA >60   < > >60   < > >60   < > >60 52* >60  ANIONGAP 10   < > 10   < > 13   < > 13 11 16*   < > = values in this interval not displayed.     Hematology Recent Labs  Lab 03/14/20 0239 03/15/20 0307 03/16/20 0329  WBC 16.3* 10.7* 10.4  RBC 4.08 4.55 4.28  HGB 13.0 14.7 13.6  HCT 38.2 43.7 41.4  MCV 93.6 96.0 96.7  MCH 31.9 32.3 31.8  MCHC 34.0 33.6 32.9  RDW 13.4 13.8 14.0  PLT 233 220 196    BNPNo results for input(s): BNP, PROBNP in the last 168 hours.   DDimer No results for input(s): DDIMER in the last 168 hours.   Radiology    DG Chest Port 1 View  Result Date: 03/16/2020 CLINICAL DATA:  Acute respiratory failure EXAM: PORTABLE CHEST 1 VIEW COMPARISON:  03/14/2020 FINDINGS: Borderline heart size. Diffuse airspace disease throughout both lungs. Probable small effusions. Changes likely due to edema although diffuse pneumonia could have this appearance. Appearances are probably similar to prior study, lying for differences in technique. The endotracheal and enteric tubes were removed. IMPRESSION: Diffuse bilateral airspace disease with probable small effusions. Electronically Signed   By: Burman Nieves M.D.   On: 03/16/2020 06:39    Cardiac Studies   Echo 03/12/20  IMPRESSIONS    1. Left ventricular  ejection fraction, by estimation, is 50 to 55%. The  left ventricle has low normal function. The left ventricle has no regional  wall motion abnormalities. There is moderate asymmetric left ventricular  hypertrophy of the basal-septal  segment. Left ventricular diastolic parameters are indeterminate.  2. Right ventricular systolic function is mildly reduced. The right  ventricular size is normal. There is mildly elevated pulmonary artery  systolic pressure. The estimated right ventricular systolic pressure is  35.3 mmHg.  3. The mitral valve is degenerative. Severe mitral annular calcification.  Trivial mitral valve regurgitation. Severe mitral stenosis. MG 12 mmHg at  HR 105 bpm, MVA 0.7cm^2 by continuity equation. Gradients increased due to  Afib with RVR  4. The aortic valve is tricuspid. Aortic valve regurgitation is not  visualized. Mild to moderate aortic valve sclerosis/calcification is  present, without any evidence of aortic stenosis.  5. The inferior vena cava is normal in size with greater than 50%  respiratory variability, suggesting right atrial pressure of 3 mmHg.   FINDINGS  Left Ventricle: Left ventricular ejection fraction, by estimation, is 50  to 55%. The left ventricle has low normal function. The left ventricle has  no regional wall motion abnormalities. The left ventricular internal  cavity size was small. There is  moderate asymmetric left ventricular hypertrophy of the basal-septal  segment. Left ventricular diastolic parameters are indeterminate.  Right Ventricle: The right ventricular size is normal. Right vetricular  wall thickness was not assessed. Right ventricular systolic function is  mildly reduced. There is mildly elevated pulmonary artery systolic  pressure. The tricuspid regurgitant  velocity is 2.84 m/s, and with an assumed right atrial pressure of 3 mmHg,  the estimated right ventricular systolic pressure is 35.3 mmHg.   Left Atrium: Left  atrial size was normal in size.   Right Atrium: Right atrial size was normal in size.   Pericardium: There is no evidence of pericardial effusion.   Mitral Valve: The mitral valve is degenerative in appearance. Severe  mitral annular calcification. Trivial mitral valve regurgitation. Severe  mitral valve stenosis. MV peak gradient, 24.3 mmHg. The mean mitral valve  gradient is 11.0 mmHg.   Tricuspid Valve: The tricuspid valve is normal in structure. Tricuspid  valve regurgitation is trivial.   Aortic Valve: The aortic valve is tricuspid. Aortic valve regurgitation is  not visualized. Mild to moderate aortic valve sclerosis/calcification is  present, without any evidence of aortic stenosis.   Pulmonic Valve: The pulmonic valve was not well visualized. Pulmonic valve  regurgitation is not visualized.   Aorta: The aortic root and ascending aorta are structurally normal, with  no evidence of dilitation.   Venous: The inferior vena cava is normal in size with greater than 50%  respiratory variability, suggesting right atrial pressure of 3 mmHg.   IAS/Shunts: No atrial level shunt detected by color flow Doppler.   Patient Profile     74 y.o. female with subarachnoid hemorrhage COPD hyponatremia atrial fibrillation with rapid ventricular spots with moderate severe mitral stenosis  Assessment & Plan    A. fib with RVR -Continueamiodarone IV which was initiated on 03/13/2020. with elevated HR add IV amiodarone bolus 150 -Continue with low-dosedigoxin -Change Cardizem to IV for improved rate control, blood pressure tolerating and are increasing to 15 -No anticoagulation because of intracranial bleed. Drips have been stopped today pt now comfort care.  DNR.    Mitral stenosis -We would like to see as best rate control as possible. This will improve overall cardiac function. -Personally reviewed echocardiogram, marked mitral annular calcification, mean gradient 10 to 12 mmHg  across mitral valve. Interestingly, left atrium does not appear to be significantly dilated. -Continue with IV Lasix. Carefully monitoring creatinine. Rate control helpful. - palliative care did consult and to meet with pt's family today - note not yet complete but pt is now comfort care. Dr. Anne Fu did discuss with Dr. Horton Chin hemorrhageper primary team.       For questions or updates, please contact [facility] Please consult [email or web address] for contact info under        Signed, Nada Boozer, NP  03/17/2020, 11:20 AM

## 2020-03-17 NOTE — TOC Initial Note (Signed)
Transition of Care [facility]) - Initial/Assessment Note    Patient Details  Name: Dana [name] MRN: [id number] Date of Birth: 1946-02-24  Transition of Care [facility]) CM/SW Contact:    Golda Acre, RN Phone Number: 03/17/2020, 9:02 AM  Clinical Narrative:                 Pt with multiple medical problems copd, subarachnoid bleed from a fall. A.fib. hfnc at 4l/min, iv solu medrol, iv amiodarone, iv Cardizem, iv rocephin. Following for progression.    Expected Discharge Plan: Skilled Nursing Facility Barriers to Discharge: Continued Medical Work up   Patient Goals and CMS Choice Patient states their goals for this hospitalization and ongoing recovery are:: i want to die CMS Medicare.gov Compare Post Acute Care list provided to:: Patient    Expected Discharge Plan and Services Expected Discharge Plan: Skilled Nursing Facility   Discharge Planning Services: CM Consult Post Acute Care Choice: Skilled Nursing Facility Living arrangements for the past 2 months: Single Family Home                                      Prior Living Arrangements/Services Living arrangements for the past 2 months: Single Family Home Lives with:: Self Patient language and need for interpreter reviewed:: Yes Do you feel safe going back to the place where you live?: Yes      Need for Family Participation in Patient Care: Yes (Comment) Care giver support system in place?: Yes (comment)   Criminal Activity/Legal Involvement Pertinent to Current Situation/Hospitalization: No - Comment as needed  Activities of Daily Living Home Assistive Devices/Equipment: None ADL Screening (condition at time of admission) Patient's cognitive ability adequate to safely complete daily activities?: Yes Is the patient deaf or have difficulty hearing?: Yes Does the patient have difficulty seeing, even when wearing glasses/contacts?: No Does the patient have difficulty concentrating, remembering, or making  decisions?: Yes Patient able to express need for assistance with ADLs?: Yes Does the patient have difficulty dressing or bathing?: No Independently performs ADLs?: No Does the patient have difficulty walking or climbing stairs?: Yes Weakness of Legs: Both Weakness of Arms/Hands: None  Permission Sought/Granted                  Emotional Assessment Appearance:: Appears stated age Attitude/Demeanor/Rapport: Irrational Affect (typically observed): Flat Orientation: : Oriented to Self, Oriented to Place, Oriented to  Time, Oriented to Situation   Psych Involvement: No (comment)  Admission diagnosis:  Hyperkalemia [E87.5] Hyponatremia [E87.1] Tobacco abuse [Z72.0] SAH (subarachnoid hemorrhage) (HCC) [I60.9] Subarachnoid bleed (HCC) [I60.9] Atrial fibrillation with RVR (HCC) [I48.91] Fall, initial encounter [W19.XXXA] Patient Active Problem List   Diagnosis Date Noted  . Goals of care, counseling/discussion   . Advanced care planning/counseling discussion   . Palliative care by specialist   . Acute respiratory failure (HCC)   . Hypotension 03/12/2020  . Fall at home, initial encounter 03/12/2020  . Atrial fibrillation with RVR (HCC) 03/12/2020  . Hyperkalemia 03/12/2020  . Subarachnoid bleed (HCC) 03/11/2020  . Iron deficiency anemia 12/20/2019  . CKD (chronic kidney disease), stage II 04/04/2019  . Microalbuminuria due to type 2 diabetes mellitus (HCC) 04/04/2019  . Tobacco use disorder 12/24/2018  . Hyponatremia 12/24/2018  . Type 2 diabetes mellitus with diabetic neuropathy, without long-term current use of insulin (HCC) 01/07/2018  . Hypertension with heart disease 01/07/2018  . Hyperlipidemia associated with type 2  diabetes mellitus (HCC) 01/07/2018  . Major depressive disorder in remission (HCC) 01/07/2018  . Fibromyalgia 01/07/2018  . COPD with acute exacerbation (HCC) 01/04/2018  . Unstable gait 01/04/2018   PCP:  [place], Betty G, MD Pharmacy:   [facility] DELIVERY - [address], [place] - [address] [place] [address] Phone: [phone] Fax: [phone]  [facility] - [place], [place] - [address] [facility] [address] [place] [address] Phone: [phone] Fax: [phone]     Social Determinants of Health (SDOH) Interventions    Readmission Risk Interventions No flowsheet data found.

## 2020-03-17 NOTE — Discharge Summary (Signed)
Physician Discharge Summary  Dana [name] WUJ:[id number] DOB: 1946/02/12 DOA: 03/11/2020  PCP: [place], Dana [name]  Admit date: 03/11/2020 Discharge date: 03/17/2020  Admitted From: Home Disposition: Residential Hospice  Recommendations for Outpatient Follow-up:  1. Further care per Hospice Protocol   Home Health: No Equipment/Devices: None   Discharge Condition: Guarded  CODE STATUS: DO NOT RESUSCITATE Diet recommendation:   Brief/Interim Summary: HPI per Dr. Ileene [name] on 03/11/20 Dana Thelander Pierceis a 74 y.o.femalewith medical history significant forCOPD, hypertension, type 2 diabetes, hyperlipidemia, chronic kidney disease stage II, iron deficiency anemia, depression and fibromyalgia who presents with concerns of altered mental status/recurrent falls.  Patient reportedly had a fall about 5 days ago. She lives alone but son received a phone call that she had fallen. When son arrived she was sitting up on the floor but had a lot of bleeding coming from a laceration to her chin. She does not recall why she fell but thinks that is because she is weak. Daughter-in-law went to bring her groceries today and saw her fall onto her couch and decided to bring her into the ED for evaluation. Son reports that she chronically has weakness and had brought her a walker in the past but she does not use it.She does not have history of frequent falls.She denies feeling any dizziness or lightheadedness. No chest pain or shortness of breath. No nausea, vomiting or diarrhea. Denies any fever.  In the ED, patient was noted to be in atrial fibrillation with RVR with rates up to 130 which is new for her. She was given diltiazem bolus butinfusion was discontinued since she became hypotensive. Chest x-ray notable for cardiomegaly and vascular congestion. Son reports that she has history of mitral valve stenosis diagnosed about 3 to 4 years ago.  CT head was significant for a small  frontal subarachnoid hemorrhage. Neurosurgery has been consulted and recommend repeat CT head in the morning with no surgical intervention. Will need tohold off on any blood thinneroranticoagulation.  Patient also noted to be hyponatremic with a corrected sodium of 121. She appears to be chronically hyponatremic with baseline around 127. Son is unsure whether she has decreased p.o. intake but does mention that patient likes to chew a lot of ice. Patient denies any alcohol use.  **Interim History  Patient was in A. fib with RVR today and currently asymptomatic but had uncontrolled heart rates.  She is limited by hypotension so cardiology was consulted and they recommended digoxin.  Unable to safely anticoagulate given her subarachnoid hemorrhage but later on long-term she should be on a DOAC if falls can be prevented per cardiology recommendations.  ECHOCardiogram done and showed that the left ventricular diastolic parameters were indeterminate and she had an EF of 50 to 55%.  The right ventricular systolic function was mildly reduced and the mitral valve was degenerative with severe mitral annular calcification and severe mitral stenosis.  On the night of 7/29-7/30 she went into respiratory distress and then subsequently acute respiratory failure due to pulmonary edema and altered altered mental status.  She had a CT of the chest which was concerning for volume overload and upon arriving on CT she was found to be profoundly hypoxic with saturations in the 70s.  She was placed on a 15 L nonrebreather and intubated and transferred to the intensive care unit and critical care evaluated and took over the patient.  She was extubated on 03/14/2020 and continue to have acute hypoxic/hypercarbic respiratory failure and cardiology was evaluating  and she was treated for COPD exacerbation given IV Solu-Medrol given her significant bronchospasm as well as bronchodilators.  Cardiology recommended amiodarone drip  and no anticoagulation in the setting of her subarachnoid hemorrhage and converted her to IV Cardizem as well as IV digoxin.  She developed an AKI with increased creatinine with Lasix and Lasix was held for 24 hours.  Her goals of care was discussed as she has been declining over the past few months with concern for dementia.  Patient then subsequently wanted no intubation again.  On 03/16/2020 she started refusing care so palliative care was consulted for further goals of care discussion.  It appeared that she was aspirating and SLP was evaluating but held off on MBS due to goals of care discussion.  Palliative care met with the patient and the family this morning and patient reiterated that she just wanted to "sleep and die".  After extensive goals of care discussion and decision was made to transition to full comfort care and patient was transitioned to comfort measures with interventions not and her comfort being discontinued.  A referral was made for [facility] and unfortunately did not have a bed today but will have a bed tomorrow.  All medications and interventions nontender for comfort were discontinued and she is going to be transitioned to the residential hospice in the morning.  Discharge Diagnoses:  Principal Problem:   Subarachnoid bleed ([place]) Active Problems:   Type 2 diabetes mellitus with diabetic neuropathy, without long-term current use of insulin (HCC)   Major depressive disorder in remission ([place])   Fibromyalgia   COPD with acute exacerbation (HCC)   Tobacco use disorder   Hyponatremia   Iron deficiency anemia   Hypotension   Fall at home, initial encounter   Atrial fibrillation with RVR ([place])   Hyperkalemia   Acute respiratory failure ([place])   Goals of care, counseling/discussion   Advanced care planning/counseling discussion   Palliative care by specialist   Comfort measures only status   DNR (do not resuscitate)  Acute hypoxemic/hypercarbic respiratory failure secondary  to pulmonary edema and bilateral pleural effusions and AECOPD. Can't exclude element of aspiration  Smoker  Aspiration risk   Subarachnoid hemorrhage: small frontal bleed, likely traumatic  Atrial fibrillation with RVR, new onset.  Moderate -severe MS  AKI -developed increased creatinine with Lasix, hold for 24 hours-->now better   Hyponatremia:   Uncontrolled hyperglycemia, due to steroids  Goals of care:  DO NOT RESUSCITATE and DO NOT INTUBATE and now she is full comfort measures  Patient met with the palliative care team today and they attempted goals of care discussion and she stated that she "just wants to die".  She denied any suicidal ideation or having a plan.  She also refused for medications or drugs and family is noted a decline over the past 6 to 12 months with decreased hygiene, oral intake which has resulted with this.  She is also been living essentially bed to chair sleeping and being awake.  She has an increase number of falls prior to admission as well.  And comfort care was discussed with the family and family and the patient has elected for comfort care measures and they are going to allow for comfort feeding with no IV fluids, no further diagnostic testing or life-prolonging interventions.  All medications not on patient's comfort were discontinued and she will be transition to residential hospice once a bed is available in the morning.  Discharge Instructions   Allergies as  of 03/17/2020      Reactions   Bactrim [sulfamethoxazole-trimethoprim] Hives      Medication List    STOP taking these medications   aspirin 81 MG tablet   calcium citrate-vitamin D 500-400 MG-UNIT chewable tablet   cetirizine 10 MG tablet Commonly known as: ZYRTEC   Cranberry 500 MG Caps   desloratadine 5 MG tablet Commonly known as: CLARINEX   iron polysaccharides 150 MG capsule Commonly known as: NIFEREX   Januvia 50 MG tablet Generic drug: sitaGLIPtin   Livalo 2 MG  Tabs Generic drug: Pitavastatin Calcium   losartan 25 MG tablet Commonly known as: COZAAR   metFORMIN 1000 MG tablet Commonly known as: GLUCOPHAGE   metoprolol succinate 50 MG 24 hr tablet Commonly known as: TOPROL-XL   montelukast 10 MG tablet Commonly known as: SINGULAIR   Savella 50 MG Tabs tablet Generic drug: Milnacipran   vitamin C 500 MG tablet Commonly known as: ASCORBIC ACID   vitamin E 180 MG (400 UNITS) capsule Generic drug: vitamin E     TAKE these medications   acetaminophen 325 MG tablet Commonly known as: TYLENOL Take 2 tablets (650 mg total) by mouth every 6 (six) hours as needed for mild pain (or Fever >/= 101).   antiseptic oral rinse Liqd Apply 15 mLs topically as needed for dry mouth.   docusate sodium 100 MG capsule Commonly known as: COLACE Take 1 capsule (100 mg total) by mouth 2 (two) times daily.   glycopyrrolate 1 MG tablet Commonly known as: ROBINUL Take 1 tablet (1 mg total) by mouth every 4 (four) hours as needed (excessive secretions).   guaiFENesin 600 MG 12 hr tablet Commonly known as: MUCINEX Take 1 tablet (600 mg total) by mouth 2 (two) times daily.   haloperidol 0.5 MG tablet Commonly known as: HALDOL Take 1 tablet (0.5 mg total) by mouth every 4 (four) hours as needed for agitation (or delirium).   LORazepam 1 MG tablet Commonly known as: ATIVAN Take 1 tablet (1 mg total) by mouth every 4 (four) hours as needed for anxiety.   mirtazapine 30 MG tablet Commonly known as: REMERON TAKE 1 TABLET AT BEDTIME   morphine CONCENTRATE 10 MG/0.5ML Soln concentrated solution Take 0.25 mLs (5 mg total) by mouth every hour as needed for moderate pain (or dyspnea).   nicotine 21 mg/24hr patch Commonly known as: NICODERM CQ - dosed in mg/24 hours Place 1 patch (21 mg total) onto the skin daily. Start taking on: March 18, 2020   OLANZapine zydis 5 MG disintegrating tablet Commonly known as: ZYPREXA Take 0.5 tablets (2.5 mg total) by  mouth at bedtime.   ondansetron 4 MG disintegrating tablet Commonly known as: ZOFRAN-ODT Take 1 tablet (4 mg total) by mouth every 6 (six) hours as needed for nausea.   polyethylene glycol 17 g packet Commonly known as: MIRALAX / GLYCOLAX Take 17 g by mouth daily. Start taking on: March 18, 2020   polyvinyl alcohol 1.4 % ophthalmic solution Commonly known as: LIQUIFILM TEARS Place 1 drop into both eyes 4 (four) times daily as needed for dry eyes.       Allergies  Allergen Reactions  . Bactrim [Sulfamethoxazole-Trimethoprim] Hives    Consultations:  PCCM/Pulmonary  Cardiology  Palliative Care Medicine  Procedures/Studies: DG Chest 2 View  Result Date: 03/11/2020 CLINICAL DATA:  Fall EXAM: CHEST - 2 VIEW COMPARISON:  None. FINDINGS: Small bilateral pleural effusions. Mild cardiomegaly with central congestion. Mild diffuse bilateral interstitial and ground-glass opacity. Aortic  atherosclerosis. No pneumothorax. IMPRESSION: Mild cardiomegaly with central vascular congestion and small pleural effusions. Mild diffuse interstitial and ground-glass opacity, probably reflecting mild pulmonary edema, less likely atypical infection. Electronically Signed   By: Donavan Foil M.D.   On: 03/11/2020 19:49   DG Pelvis 1-2 Views  Result Date: 03/11/2020 CLINICAL DATA:  Fall EXAM: PELVIS - 1-2 VIEW COMPARISON:  None. FINDINGS: There is no evidence of pelvic fracture or diastasis. No pelvic bone lesions are seen. Vascular calcifications. IMPRESSION: Negative. Electronically Signed   By: Donavan Foil M.D.   On: 03/11/2020 19:49   CT HEAD WO CONTRAST  Result Date: 03/13/2020 CLINICAL DATA:  Subarachnoid hemorrhage EXAM: CT HEAD WITHOUT CONTRAST TECHNIQUE: Contiguous axial images were obtained from the base of the skull through the vertex without intravenous contrast. COMPARISON:  March 12, 2020, March 11, 2020 FINDINGS: Brain: Similar appearance of subarachnoid hemorrhage overlying the RIGHT  frontal convexity. Unchanged size and configuration of the ventricular system. No new hemorrhage. Periventricular white matter hypodensities consistent with sequela of chronic microvascular ischemic disease. Vascular: Conspicuity of the vessels secondary to recent contrast administration. Skull: No acute finding. Sinuses/Orbits: Chronic sinus disease of the LEFT maxillary sinus, frontal sinus, and ethmoid air cells. Status post cataract surgery. Other: None IMPRESSION: Similar appearance of subarachnoid hemorrhage overlying the RIGHT frontal convexity. No new hemorrhage. Electronically Signed   By: Valentino Saxon [name]   On: 03/13/2020 07:55   CT HEAD WO CONTRAST  Result Date: 03/12/2020 CLINICAL DATA:  Subarachnoid hemorrhage follow-up EXAM: CT HEAD WITHOUT CONTRAST TECHNIQUE: Contiguous axial images were obtained from the base of the skull through the vertex without intravenous contrast. COMPARISON:  03/11/2020 FINDINGS: Brain: Small volume subarachnoid hemorrhage is again identified along the right central sulcus. No new hemorrhage. Ventricles are stable in size. Stable findings of chronic microvascular ischemic changes. Gray-white differentiation remains preserved. Vascular: There is atherosclerotic calcification at the skull base. Skull: Calvarium is unremarkable. Sinuses/Orbits: Opacified partially imaged left frontal, maxillary, and anterior ethmoid sinuses. Obstructing ostiomeatal unit lesion such as a polyp is not excluded. No acute orbital abnormality. Other: Mastoid air cells are clear. IMPRESSION: Stable small volume sulcal subarachnoid hemorrhage. No new findings. Electronically Signed   By: Macy Mis M.D.   On: 03/12/2020 10:04   CT Head Wo Contrast  Result Date: 03/11/2020 CLINICAL DATA:  74 year old female with facial trauma. EXAM: CT HEAD WITHOUT CONTRAST CT MAXILLOFACIAL WITHOUT CONTRAST CT CERVICAL SPINE WITHOUT CONTRAST TECHNIQUE: Multidetector CT imaging of the head, cervical  spine, and maxillofacial structures were performed using the standard protocol without intravenous contrast. Multiplanar CT image reconstructions of the cervical spine and maxillofacial structures were also generated. COMPARISON:  None. FINDINGS: Evaluation of this exam is limited due to motion artifact. CT HEAD FINDINGS Brain: There is moderate age-related atrophy and chronic microvascular ischemic changes. There is a small right frontal convexity subarachnoid hemorrhage. No mass effect or midline shift. Vascular: No hyperdense vessel or unexpected calcification. Skull: Normal. Negative for fracture or focal lesion. Other: None CT MAXILLOFACIAL FINDINGS Osseous: No acute fracture. No mandibular subluxation. Orbits: The globes and retro-orbital fat are preserved. Sinuses: Chronic appearing complete opacification of the left maxillary sinus and ethmoid air cells with remodeling of the surrounding bones. No air-fluid level. The mastoid air cells are clear. Soft tissues: Negative. CT CERVICAL SPINE FINDINGS Alignment: No acute subluxation. There is reversal of normal cervical lordosis which may be positional or due to chronic spasm. Skull base and vertebrae: No acute fracture. Evaluation however is  limited due to respiratory motion artifact. Soft tissues and spinal canal: No prevertebral fluid or swelling. No visible canal hematoma. Disc levels: Multilevel degenerative changes with multilevel facet arthropathy. Upper chest: Partially visualized right pleural effusion. Other: Bilateral carotid bulb calcified plaques. IMPRESSION: 1. Small right frontal convexity subarachnoid hemorrhage. 2. No acute/traumatic cervical spine pathology. 3. No acute facial bone fractures. 4. Chronic left paranasal sinus disease. These results were called by telephone at the time of interpretation on 03/11/2020 at 9:09 pm to provider [facility] , who verbally acknowledged these results. Electronically Signed   By: Anner Crete M.D.    On: 03/11/2020 21:08   CT ANGIO CHEST PE W OR WO CONTRAST  Result Date: 03/13/2020 CLINICAL DATA:  Shortness of breath EXAM: CT ANGIOGRAPHY CHEST WITH CONTRAST TECHNIQUE: Multidetector CT imaging of the chest was performed using the standard protocol during bolus administration of intravenous contrast. Multiplanar CT image reconstructions and MIPs were obtained to evaluate the vascular anatomy. CONTRAST:  53m OMNIPAQUE IOHEXOL 350 MG/ML SOLN COMPARISON:  Chest radiograph March 11, 2020 FINDINGS: Cardiovascular: There is no demonstrable pulmonary embolus. There is no thoracic aortic aneurysm. There is no obvious dissection. Note that the contrast bolus within the aorta is not sufficient for assessment for possess 10 Scholl aortic dissection. There are foci of calcification in proximal visualized great vessels. There is aortic atherosclerosis. There are multiple foci of coronary artery calcification. A small amount of pericardial fluid is within physiologic range. There is no pericardial thickening. Heart is mildly enlarged. Mediastinum/Nodes: Visualized thyroid appears unremarkable. There are subcentimeter mediastinal lymph nodes. There is no appreciable thoracic adenopathy by size criteria. No esophageal lesions are appreciable. Lungs/Pleura: There are moderate free-flowing pleural effusions bilaterally. There is diffuse interstitial pulmonary edema areas of patchy airspace opacity bilaterally likely represent alveolar edema. Upper Abdomen: There is reflux of contrast into the inferior vena cava and hepatic veins. There is abdominal aortic and splenic artery calcification. Visualized upper abdominal structures otherwise appear normal. Musculoskeletal: No blastic or lytic bone lesions. No chest wall lesions evident. Review of the MIP images confirms the above findings. IMPRESSION: 1. No demonstrable pulmonary embolus. No thoracic aortic aneurysm. No dissection seen; note that the contrast bolus in the aorta is  not sufficient for potential dissection assessment. There is aortic atherosclerosis as well as great vessel and coronary artery calcification. 2. Bilateral pleural effusions with diffuse interstitial pulmonary edema. Areas of patchy airspace opacity likely represent patchy alveolar edema. The overall appearance is felt to be most indicative of a degree of congestive heart failure. 3. Reflux of contrast into the inferior vena cava and hepatic veins is likely indicative of increase in right heart pressure. Aortic Atherosclerosis (ICD10-I70.0). Electronically Signed   By: WLowella GripIII M.D.   On: 03/13/2020 04:56   CT Cervical Spine Wo Contrast  Result Date: 03/11/2020 CLINICAL DATA:  74year old female with facial trauma. EXAM: CT HEAD WITHOUT CONTRAST CT MAXILLOFACIAL WITHOUT CONTRAST CT CERVICAL SPINE WITHOUT CONTRAST TECHNIQUE: Multidetector CT imaging of the head, cervical spine, and maxillofacial structures were performed using the standard protocol without intravenous contrast. Multiplanar CT image reconstructions of the cervical spine and maxillofacial structures were also generated. COMPARISON:  None. FINDINGS: Evaluation of this exam is limited due to motion artifact. CT HEAD FINDINGS Brain: There is moderate age-related atrophy and chronic microvascular ischemic changes. There is a small right frontal convexity subarachnoid hemorrhage. No mass effect or midline shift. Vascular: No hyperdense vessel or unexpected calcification. Skull: Normal. Negative for fracture  or focal lesion. Other: None CT MAXILLOFACIAL FINDINGS Osseous: No acute fracture. No mandibular subluxation. Orbits: The globes and retro-orbital fat are preserved. Sinuses: Chronic appearing complete opacification of the left maxillary sinus and ethmoid air cells with remodeling of the surrounding bones. No air-fluid level. The mastoid air cells are clear. Soft tissues: Negative. CT CERVICAL SPINE FINDINGS Alignment: No acute  subluxation. There is reversal of normal cervical lordosis which may be positional or due to chronic spasm. Skull base and vertebrae: No acute fracture. Evaluation however is limited due to respiratory motion artifact. Soft tissues and spinal canal: No prevertebral fluid or swelling. No visible canal hematoma. Disc levels: Multilevel degenerative changes with multilevel facet arthropathy. Upper chest: Partially visualized right pleural effusion. Other: Bilateral carotid bulb calcified plaques. IMPRESSION: 1. Small right frontal convexity subarachnoid hemorrhage. 2. No acute/traumatic cervical spine pathology. 3. No acute facial bone fractures. 4. Chronic left paranasal sinus disease. These results were called by telephone at the time of interpretation on 03/11/2020 at 9:09 pm to provider [facility] , who verbally acknowledged these results. Electronically Signed   By: Anner Crete M.D.   On: 03/11/2020 21:08   DG Chest Port 1 View  Result Date: 03/16/2020 CLINICAL DATA:  Acute respiratory failure EXAM: PORTABLE CHEST 1 VIEW COMPARISON:  03/14/2020 FINDINGS: Borderline heart size. Diffuse airspace disease throughout both lungs. Probable small effusions. Changes likely due to edema although diffuse pneumonia could have this appearance. Appearances are probably similar to prior study, lying for differences in technique. The endotracheal and enteric tubes were removed. IMPRESSION: Diffuse bilateral airspace disease with probable small effusions. Electronically Signed   By: Lucienne Capers M.D.   On: 03/16/2020 06:39   DG Chest Port 1 View  Result Date: 03/14/2020 CLINICAL DATA:  Acute respiratory failure EXAM: PORTABLE CHEST 1 VIEW COMPARISON:  03/13/2020 and CTA chest, 03/13/2020. Chest radiographs 03/11/2020. FINDINGS: Cardiac silhouette normal in size. Hazy airspace opacity in the lungs, most apparent in the lower lungs. There is mild interstitial thickening bilaterally. More confluent opacity at the  lung bases obscures hemidiaphragms, greater on the right, consistent with pleural effusions. These have increased from the prior exam. No pneumothorax. Endotracheal tube and nasal/orogastric tube are stable and well positioned. IMPRESSION: 1. Increased opacity at the lung bases consistent with enlarged pleural effusions. 2. Persistent interstitial thickening and hazy airspace opacities consistent with pulmonary edema. 3. Stable well-positioned support apparatus. Electronically Signed   By: Lajean Manes M.D.   On: 03/14/2020 10:18   Portable Chest x-ray  Result Date: 03/13/2020 CLINICAL DATA:  Endotracheal tube placement.  OG tube placement. EXAM: PORTABLE CHEST 1 VIEW COMPARISON:  03/11/2020. FINDINGS: Endotracheal tube noted with its tip 2 cm above the carina. NG tube noted with tip below left hemidiaphragm. Heart size normal. Progressive bilateral pulmonary infiltrates/edema. No pleural effusion or pneumothorax. IMPRESSION: 1.  Endotracheal tube and NG tube noted good anatomic position. 2. Diffuse bilateral pulmonary infiltrates/edema, progressed from prior exam. Electronically Signed   By: [place]   On: 03/13/2020 07:36   DG Abd Portable 1V  Result Date: 03/13/2020 CLINICAL DATA:  Endotracheal and OG tube placement. EXAM: PORTABLE ABDOMEN - 1 VIEW COMPARISON:  Chest x-ray 03/11/2020. FINDINGS: NG tube noted with its tip over the stomach in good anatomic position. Mild small and large bowel distention cannot be excluded. Bilateral pulmonary infiltrates. IMPRESSION: 1. NG tube noted with its tip over the stomach in good anatomic position. Mild small large bowel distention cannot be excluded. 2.  Bilateral pulmonary infiltrates. Electronically Signed   By: Marcello Moores  Register   On: 03/13/2020 07:37   ECHOCARDIOGRAM COMPLETE  Result Date: 03/12/2020    ECHOCARDIOGRAM REPORT   Patient Name:   CYNTHEA ZACHMAN Date of Exam: 03/12/2020 Medical Rec #:  [id number]            Height:       61.0 in  Accession #:    [id number]           Weight:       125.4 lb Date of Birth:  Apr 05, 1946             BSA:          1.549 m Patient Age:    56 years             BP:           94/68 mmHg Patient Gender: F                    HR:           115 bpm. Exam Location:  Inpatient Procedure: 2D Echo Indications:     Atrial Fibrillation 427.31 / I48.91  History:         Patient has no prior history of Echocardiogram examinations.                  COPD; Risk Factors:Diabetes, Current Smoker, Hypertension and                  Dyslipidemia. Chronic kidney disease, anemia. Emphysema.  Sonographer:     Darlina Sicilian RDCS Referring Phys:  [id number] [facility] Diagnosing Phys: Oswaldo Milian [name] IMPRESSIONS  1. Left ventricular ejection fraction, by estimation, is 50 to 55%. The left ventricle has low normal function. The left ventricle has no regional wall motion abnormalities. There is moderate asymmetric left ventricular hypertrophy of the basal-septal segment. Left ventricular diastolic parameters are indeterminate.  2. Right ventricular systolic function is mildly reduced. The right ventricular size is normal. There is mildly elevated pulmonary artery systolic pressure. The estimated right ventricular systolic pressure is 51.7 mmHg.  3. The mitral valve is degenerative. Severe mitral annular calcification. Trivial mitral valve regurgitation. Severe mitral stenosis. MG 12 mmHg at HR 105 bpm, MVA 0.7cm^2 by continuity equation. Gradients increased due to Afib with RVR  4. The aortic valve is tricuspid. Aortic valve regurgitation is not visualized. Mild to moderate aortic valve sclerosis/calcification is present, without any evidence of aortic stenosis.  5. The inferior vena cava is normal in size with greater than 50% respiratory variability, suggesting right atrial pressure of 3 mmHg. FINDINGS  Left Ventricle: Left ventricular ejection fraction, by estimation, is 50 to 55%. The left ventricle has low normal function. The left  ventricle has no regional wall motion abnormalities. The left ventricular internal cavity size was small. There is moderate asymmetric left ventricular hypertrophy of the basal-septal segment. Left ventricular diastolic parameters are indeterminate. Right Ventricle: The right ventricular size is normal. Right vetricular wall thickness was not assessed. Right ventricular systolic function is mildly reduced. There is mildly elevated pulmonary artery systolic pressure. The tricuspid regurgitant velocity is 2.84 m/s, and with an assumed right atrial pressure of 3 mmHg, the estimated right ventricular systolic pressure is 61.6 mmHg. Left Atrium: Left atrial size was normal in size. Right Atrium: Right atrial size was normal in size. Pericardium: There is no evidence of pericardial effusion. Mitral Valve: The mitral valve  is degenerative in appearance. Severe mitral annular calcification. Trivial mitral valve regurgitation. Severe mitral valve stenosis. MV peak gradient, 24.3 mmHg. The mean mitral valve gradient is 11.0 mmHg. Tricuspid Valve: The tricuspid valve is normal in structure. Tricuspid valve regurgitation is trivial. Aortic Valve: The aortic valve is tricuspid. Aortic valve regurgitation is not visualized. Mild to moderate aortic valve sclerosis/calcification is present, without any evidence of aortic stenosis. Pulmonic Valve: The pulmonic valve was not well visualized. Pulmonic valve regurgitation is not visualized. Aorta: The aortic root and ascending aorta are structurally normal, with no evidence of dilitation. Venous: The inferior vena cava is normal in size with greater than 50% respiratory variability, suggesting right atrial pressure of 3 mmHg. IAS/Shunts: No atrial level shunt detected by color flow Doppler.  LEFT VENTRICLE PLAX 2D LVIDd:         3.30 cm LVIDs:         2.80 cm LV PW:         1.30 cm LV IVS:        1.40 cm LVOT diam:     1.60 cm LV SV:         27 LV SV Index:   17 LVOT Area:     2.01 cm   LEFT ATRIUM             Index       RIGHT ATRIUM           Index LA diam:        4.20 cm 2.71 cm/m  RA Area:     10.50 cm LA Vol (A2C):   27.9 ml 18.01 ml/m RA Volume:   19.00 ml  12.27 ml/m LA Vol (A4C):   43.4 ml 28.02 ml/m LA Biplane Vol: 35.6 ml 22.98 ml/m  AORTIC VALVE LVOT Vmax:   75.90 cm/s LVOT Vmean:  51.500 cm/s LVOT VTI:    0.134 m  AORTA Ao Root diam: 2.60 cm MITRAL VALVE                TRICUSPID VALVE MV Area (PHT): 1.93 cm     TR Peak grad:   32.3 mmHg MV Peak grad:  24.3 mmHg    TR Vmax:        284.00 cm/s MV Mean grad:  11.0 mmHg MV Vmax:       2.46 m/s     SHUNTS MV Vmean:      159.0 cm/s   Systemic VTI:  0.13 m MV Decel Time: 393 msec     Systemic Diam: 1.60 cm MV E velocity: 179.33 cm/s Oswaldo Milian [name] Electronically signed by Oswaldo Milian [name] Signature Date/Time: 03/12/2020/4:50:57 PM    Final (Updated)    CT Maxillofacial Wo Contrast  Result Date: 03/11/2020 CLINICAL DATA:  74 year old female with facial trauma. EXAM: CT HEAD WITHOUT CONTRAST CT MAXILLOFACIAL WITHOUT CONTRAST CT CERVICAL SPINE WITHOUT CONTRAST TECHNIQUE: Multidetector CT imaging of the head, cervical spine, and maxillofacial structures were performed using the standard protocol without intravenous contrast. Multiplanar CT image reconstructions of the cervical spine and maxillofacial structures were also generated. COMPARISON:  None. FINDINGS: Evaluation of this exam is limited due to motion artifact. CT HEAD FINDINGS Brain: There is moderate age-related atrophy and chronic microvascular ischemic changes. There is a small right frontal convexity subarachnoid hemorrhage. No mass effect or midline shift. Vascular: No hyperdense vessel or unexpected calcification. Skull: Normal. Negative for fracture or focal lesion. Other: None CT MAXILLOFACIAL FINDINGS Osseous: No acute fracture. No mandibular  subluxation. Orbits: The globes and retro-orbital fat are preserved. Sinuses: Chronic appearing complete  opacification of the left maxillary sinus and ethmoid air cells with remodeling of the surrounding bones. No air-fluid level. The mastoid air cells are clear. Soft tissues: Negative. CT CERVICAL SPINE FINDINGS Alignment: No acute subluxation. There is reversal of normal cervical lordosis which may be positional or due to chronic spasm. Skull base and vertebrae: No acute fracture. Evaluation however is limited due to respiratory motion artifact. Soft tissues and spinal canal: No prevertebral fluid or swelling. No visible canal hematoma. Disc levels: Multilevel degenerative changes with multilevel facet arthropathy. Upper chest: Partially visualized right pleural effusion. Other: Bilateral carotid bulb calcified plaques. IMPRESSION: 1. Small right frontal convexity subarachnoid hemorrhage. 2. No acute/traumatic cervical spine pathology. 3. No acute facial bone fractures. 4. Chronic left paranasal sinus disease. These results were called by telephone at the time of interpretation on 03/11/2020 at 9:09 pm to provider [facility] , who verbally acknowledged these results. Electronically Signed   By: Anner Crete M.D.   On: 03/11/2020 21:08   Significant Diagnostic Tests:  CT head 7/28 > Small right frontal convexity subarachnoid hemorrhage. CT head 7/29 > Stable small volume sulcal subarachnoid hemorrhage. Echo 7/29 > LVEF 50-55%, no regional wall motion abnormalities. Severe mitral stenosis CTA chest 7/30 > No demonstrable pulmonary embolus. Bilateral pleural effusions with diffuse interstitial pulmonary edema. Areas of patchy airspace opacity likely represent patchy alveolar edema  ECHOCARDIOGRAM IMPRESSIONS    1. Left ventricular ejection fraction, by estimation, is 50 to 55%. The  left ventricle has low normal function. The left ventricle has no regional  wall motion abnormalities. There is moderate asymmetric left ventricular  hypertrophy of the basal-septal  segment. Left ventricular  diastolic parameters are indeterminate.  2. Right ventricular systolic function is mildly reduced. The right  ventricular size is normal. There is mildly elevated pulmonary artery  systolic pressure. The estimated right ventricular systolic pressure is  22.9 mmHg.  3. The mitral valve is degenerative. Severe mitral annular calcification.  Trivial mitral valve regurgitation. Severe mitral stenosis. MG 12 mmHg at  HR 105 bpm, MVA 0.7cm^2 by continuity equation. Gradients increased due to  Afib with RVR  4. The aortic valve is tricuspid. Aortic valve regurgitation is not  visualized. Mild to moderate aortic valve sclerosis/calcification is  present, without any evidence of aortic stenosis.  5. The inferior vena cava is normal in size with greater than 50%  respiratory variability, suggesting right atrial pressure of 3 mmHg.   FINDINGS  Left Ventricle: Left ventricular ejection fraction, by estimation, is 50  to 55%. The left ventricle has low normal function. The left ventricle has  no regional wall motion abnormalities. The left ventricular internal  cavity size was small. There is  moderate asymmetric left ventricular hypertrophy of the basal-septal  segment. Left ventricular diastolic parameters are indeterminate.   Right Ventricle: The right ventricular size is normal. Right vetricular  wall thickness was not assessed. Right ventricular systolic function is  mildly reduced. There is mildly elevated pulmonary artery systolic  pressure. The tricuspid regurgitant  velocity is 2.84 m/s, and with an assumed right atrial pressure of 3 mmHg,  the estimated right ventricular systolic pressure is 79.8 mmHg.   Left Atrium: Left atrial size was normal in size.   Right Atrium: Right atrial size was normal in size.   Pericardium: There is no evidence of pericardial effusion.   Mitral Valve: The mitral valve is degenerative in appearance.  Severe  mitral annular calcification. Trivial  mitral valve regurgitation. Severe  mitral valve stenosis. MV peak gradient, 24.3 mmHg. The mean mitral valve  gradient is 11.0 mmHg.   Tricuspid Valve: The tricuspid valve is normal in structure. Tricuspid  valve regurgitation is trivial.   Aortic Valve: The aortic valve is tricuspid. Aortic valve regurgitation is  not visualized. Mild to moderate aortic valve sclerosis/calcification is  present, without any evidence of aortic stenosis.   Pulmonic Valve: The pulmonic valve was not well visualized. Pulmonic valve  regurgitation is not visualized.   Aorta: The aortic root and ascending aorta are structurally normal, with  no evidence of dilitation.   Venous: The inferior vena cava is normal in size with greater than 50%  respiratory variability, suggesting right atrial pressure of 3 mmHg.   IAS/Shunts: No atrial level shunt detected by color flow Doppler.     LEFT VENTRICLE  PLAX 2D  LVIDd:     3.30 cm  LVIDs:     2.80 cm  LV PW:     1.30 cm  LV IVS:    1.40 cm  LVOT diam:   1.60 cm  LV SV:     27  LV SV Index:  17  LVOT Area:   2.01 cm     LEFT ATRIUM       Index    RIGHT ATRIUM      Index  LA diam:    4.20 cm 2.71 cm/m RA Area:   10.50 cm  LA Vol (A2C):  27.9 ml 18.01 ml/m RA Volume:  19.00 ml 12.27 ml/m  LA Vol (A4C):  43.4 ml 28.02 ml/m  LA Biplane Vol: 35.6 ml 22.98 ml/m  AORTIC VALVE  LVOT Vmax:  75.90 cm/s  LVOT Vmean: 51.500 cm/s  LVOT VTI:  0.134 m    AORTA  Ao Root diam: 2.60 cm   MITRAL VALVE        TRICUSPID VALVE  MV Area (PHT): 1.93 cm   TR Peak grad:  32.3 mmHg  MV Peak grad: 24.3 mmHg  TR Vmax:    284.00 cm/s  MV Mean grad: 11.0 mmHg  MV Vmax:    2.46 m/s   SHUNTS  MV Vmean:   159.0 cm/s  Systemic VTI: 0.13 m  MV Decel Time: 393 msec   Systemic Diam: 1.60 cm  MV E velocity: 179.33 cm/s   Subjective: Seen and examined at bedside and she is resting.   Did not want any more blood draws and waiting for palliative procedure.  After goals of care discussion she wants to transition to comfort care and go to residential hospice.  Denies any pain currently and wants to be left alone.  No other concerns or plans at this time.  Discharge Exam: Vitals:   03/17/20 0800 03/17/20 1000  BP: 136/76 (!) 145/60  Pulse: (!) 106 90  Resp: 13 13  Temp:    SpO2: 91% 92%   Vitals:   03/17/20 0500 03/17/20 0748 03/17/20 0800 03/17/20 1000  BP:   136/76 (!) 145/60  Pulse: 91  (!) 106 90  Resp: _0 Temp:  97.9 F (36.6 C)    TempSrc:  Axillary    SpO2: 95%  91% 92%  Weight:      Height:       General: Pt is alert, awake, not in acute distress Cardiovascular: Irregularly irregular and tachycardic, S1/S2 +, no rubs, no gallops Respiratory: Diminished bilaterally with coarse  breath sounds and some wheezing.  No appreciable rhonchi, and is wearing supplemental oxygen via nasal cannula Abdominal: Soft, NT, ND, bowel sounds + Extremities: Mild 1+ edema, no cyanosis  The results of significant diagnostics from this hospitalization (including imaging, microbiology, ancillary and laboratory) are listed below for reference.    Microbiology: Recent Results (from the past 240 hour(s))  SARS Coronavirus 2 by RT PCR (hospital order, performed in [facility] hospital lab) Nasopharyngeal Nasopharyngeal Swab     Status: None   Collection Time: 03/11/20  8:35 PM   Specimen: Nasopharyngeal Swab  Result Value Ref Range Status   SARS Coronavirus 2 NEGATIVE NEGATIVE Final    Comment: (NOTE) SARS-CoV-2 target nucleic acids are NOT DETECTED.  The SARS-CoV-2 RNA is generally detectable in upper and lower respiratory specimens during the acute phase of infection. The lowest concentration of SARS-CoV-2 viral copies this assay can detect is 250 copies / mL. A negative result does not preclude SARS-CoV-2 infection and should not be used as the sole basis for  treatment or other patient management decisions.  A negative result may occur with improper specimen collection / handling, submission of specimen other than nasopharyngeal swab, presence of viral mutation(s) within the areas targeted by this assay, and inadequate number of viral copies (<250 copies / mL). A negative result must be combined with clinical observations, patient history, and epidemiological information.  Fact Sheet for Patients:   StrictlyIdeas.no  Fact Sheet for Healthcare Providers: BankingDealers.co.za  This test is not yet approved or  cleared by the [place] FDA and has been authorized for detection and/or diagnosis of SARS-CoV-2 by FDA under an Emergency Use Authorization (EUA).  This EUA will remain in effect (meaning this test can be used) for the duration of the COVID-19 declaration under Section 564(b)(1) of the Act, 21 U.S.C. section 360bbb-3(b)(1), unless the authorization is terminated or revoked sooner.  Performed at [facility], [place] [address], [place], [place] [address]   MRSA PCR Screening     Status: None   Collection Time: 03/13/20  5:24 AM   Specimen: Nasal Mucosa; Nasopharyngeal  Result Value Ref Range Status   MRSA by PCR NEGATIVE NEGATIVE Final    Comment:        The GeneXpert MRSA Assay (FDA approved for NASAL specimens only), is one component of a comprehensive MRSA colonization surveillance program. It is not intended to diagnose MRSA infection nor to guide or monitor treatment for MRSA infections. Performed at [facility], [place] [address] [place], [place] [address]   Culture, respiratory (non-expectorated)     Status: None   Collection Time: 03/13/20 12:14 PM   Specimen: Tracheal Aspirate; Respiratory  Result Value Ref Range Status   Specimen Description   Final    TRACHEAL ASPIRATE Performed at [place]  [address] [place], [place] [address]    Special Requests   Final    NONE Performed at [facility] [place], [place] [address], [place], [place] [address]    Gram Stain NO WBC SEEN RARE GRAM POSITIVE COCCI   Final   Culture   Final    Consistent with normal respiratory flora. Performed at [facility], [place] [address] [place], [place] [address]    Report Status 03/15/2020 FINAL  Final   Labs: BNP (last 3 results) No results for input(s): BNP in the last 8760 hours. Basic Metabolic Panel: Recent Labs  Lab 03/12/20 0404 03/12/20 0404 03/13/20 9450  03/13/20 0558 03/14/20 0239 03/15/20 0307 03/16/20 0329  NA 123*   < > 121* 123* 127* 129* 130*  K 4.3   < > 4.8 4.1 3.9 4.6 4.1  CL 92*  --  88*  --  92* 92* 91*  CO2 21*  --  20*  --  _0 GLUCOSE 196*  --  337*  --  198* 82 210*  BUN 18  --  13  --  18 26* 35*  CREATININE 0.50  --  0.79  --  0.81 1.18* 0.99  CALCIUM 9.3  --  9.1  --  9.1 9.0 9.2  MG  --   --  1.8  --  1.4* 2.8*  --   PHOS  --   --  4.2  --  2.9  --   --    < > = values in this interval not displayed.   Liver Function Tests: Recent Labs  Lab 03/11/20 2035 03/12/20 0404 03/13/20 0537  AST 20 18 52*  ALT _1 ALKPHOS 86 73 90  BILITOT 1.0 0.7 2.2*  PROT 7.5 6.5 7.0  ALBUMIN 4.4 3.8 4.1   No results for input(s): LIPASE, AMYLASE in the last 168 hours. No results for input(s): AMMONIA in the last 168 hours. CBC: Recent Labs  Lab 03/11/20 2035 03/11/20 2035 03/12/20 0052 03/12/20 0052 03/13/20 0537 03/13/20 0558 03/14/20 0239 03/15/20 0307 03/16/20 0329  WBC 8.3   < > 6.9  --  14.0*  --  16.3* 10.7* 10.4  NEUTROABS 6.5  --   --   --  12.2*  --   --   --   --   HGB 15.0   < > 13.6   < > 14.7 15.3* 13.0 14.7 13.6  HCT 43.5   < > 40.7   < > 43.2 45.0 38.2 43.7 41.4  MCV 93.3   < > 94.2  --  96.6  --  93.6 96.0 96.7  PLT 308   < > 259  --  310  --  233 220 196   < > = values in this interval not displayed.   Cardiac  Enzymes: No results for input(s): CKTOTAL, CKMB, CKMBINDEX, TROPONINI in the last 168 hours. BNP: Invalid input(s): POCBNP CBG: Recent Labs  Lab 03/16/20 1649 03/16/20 1943 03/16/20 2335 03/17/20 0348 03/17/20 0753  GLUCAP 325* 279* 250* 251*  251* 257*  257*   D-Dimer No results for input(s): DDIMER in the last 72 hours. Hgb A1c No results for input(s): HGBA1C in the last 72 hours. Lipid Profile No results for input(s): CHOL, HDL, LDLCALC, TRIG, CHOLHDL, LDLDIRECT in the last 72 hours. Thyroid function studies No results for input(s): TSH, T4TOTAL, T3FREE, THYROIDAB in the last 72 hours.  Invalid input(s): FREET3 Anemia work up No results for input(s): VITAMINB12, FOLATE, FERRITIN, TIBC, IRON, RETICCTPCT in the last 72 hours. Urinalysis    Component Value Date/Time   COLORURINE YELLOW 03/11/2020 1501   APPEARANCEUR CLEAR 03/11/2020 1501   LABSPEC 1.009 03/11/2020 1501   PHURINE 5.0 03/11/2020 1501   GLUCOSEU NEGATIVE 03/11/2020 1501   HGBUR SMALL (A) 03/11/2020 1501   BILIRUBINUR NEGATIVE 03/11/2020 1501   KETONESUR NEGATIVE 03/11/2020 1501   PROTEINUR NEGATIVE 03/11/2020 1501   NITRITE NEGATIVE 03/11/2020 1501   LEUKOCYTESUR TRACE (A) 03/11/2020 1501   Sepsis Labs Invalid input(s): PROCALCITONIN,  WBC,  LACTICIDVEN Microbiology Recent Results (from the past 240 hour(s))  SARS Coronavirus 2 by RT  PCR (hospital order, performed in [facility] hospital lab) Nasopharyngeal Nasopharyngeal Swab     Status: None   Collection Time: 03/11/20  8:35 PM   Specimen: Nasopharyngeal Swab  Result Value Ref Range Status   SARS Coronavirus 2 NEGATIVE NEGATIVE Final    Comment: (NOTE) SARS-CoV-2 target nucleic acids are NOT DETECTED.  The SARS-CoV-2 RNA is generally detectable in upper and lower respiratory specimens during the acute phase of infection. The lowest concentration of SARS-CoV-2 viral copies this assay can detect is 250 copies / mL. A negative result does not  preclude SARS-CoV-2 infection and should not be used as the sole basis for treatment or other patient management decisions.  A negative result may occur with improper specimen collection / handling, submission of specimen other than nasopharyngeal swab, presence of viral mutation(s) within the areas targeted by this assay, and inadequate number of viral copies (<250 copies / mL). A negative result must be combined with clinical observations, patient history, and epidemiological information.  Fact Sheet for Patients:   StrictlyIdeas.no  Fact Sheet for Healthcare Providers: BankingDealers.co.za  This test is not yet approved or  cleared by the [place] FDA and has been authorized for detection and/or diagnosis of SARS-CoV-2 by FDA under an Emergency Use Authorization (EUA).  This EUA will remain in effect (meaning this test can be used) for the duration of the COVID-19 declaration under Section 564(b)(1) of the Act, 21 U.S.C. section 360bbb-3(b)(1), unless the authorization is terminated or revoked sooner.  Performed at [facility], [place] [address], [place], [place] [address]   MRSA PCR Screening     Status: None   Collection Time: 03/13/20  5:24 AM   Specimen: Nasal Mucosa; Nasopharyngeal  Result Value Ref Range Status   MRSA by PCR NEGATIVE NEGATIVE Final    Comment:        The GeneXpert MRSA Assay (FDA approved for NASAL specimens only), is one component of a comprehensive MRSA colonization surveillance program. It is not intended to diagnose MRSA infection nor to guide or monitor treatment for MRSA infections. Performed at [facility], [place] [address], [place], [place] [address]   Culture, respiratory (non-expectorated)     Status: None   Collection Time: 03/13/20 12:14 PM   Specimen: Tracheal Aspirate; Respiratory  Result Value Ref Range Status   Specimen Description   Final     TRACHEAL ASPIRATE Performed at [place] [address] [place], [place] [address]    Special Requests   Final    NONE Performed at [facility], [place] [address] [place], [place] [address]    Gram Stain NO WBC SEEN RARE GRAM POSITIVE COCCI   Final   Culture   Final    Consistent with normal respiratory flora. Performed at [facility], [place] [address] [place], [place] [address]    Report Status 03/15/2020 FINAL  Final   Time coordinating discharge:  35 minutes  SIGNED:  Kerney Elbe, DO [facility] 03/17/2020, 4:47 PM Pager is on [place]  If 7PM-7AM, please contact night-coverage [email or web address]

## 2020-03-17 NOTE — Progress Notes (Addendum)
Daily Progress Note   Patient Name: Dana [name]       Date: 03/17/2020 DOB: Oct 18, 1945  Age: 74 y.o. MRN#: [id number] Attending Physician: Merlene Laughter, DO Primary Care Physician: [place], Betty G, MD Admit Date: 03/11/2020  Reason for Consultation/Follow-up: Establishing goals of care  Subjective: Pt evaluated in room today with family at bedside. Re-attempted goals of care conversation that pt was resistant to having yesterday--stating she "just wants to die".  Dana [name] was somewhat more open today, reiterating that she just wants to sleep and die. She notes having felt this way for quite some time. She denies suicidal ideation or having a plan. She does have a history of depression that has been treated by multiple providers. Dana [name] is unable to to fully make decisions on her own however, due to her lethargy, and inability to fully comprehend her health issues. GOC were also discussed separately with her son and daughter in law who are her primary care givers.  Family notes that she has had a noticeable decline over the past 6-36mo, with decreased hygiene and minimal oral intake resulting in weight loss.  She has also been living essentially bed to chair, sleeping more often than awake. She has had an increased number of falls as well, prior to admission.  We visited about the different approaches to care for [facility] including continuing aggressive care vs a more comfort centered approach. Dana [name] and her family are both in agreement that a more comfort centered approach would be her wishes.  Comfort care was discussed and defined as providing medications and interventions that were intended for comfort only. Allowing for comfort feeding, no IV fluids, no further diagnostics or life  prolonging medications.  We discussed what the different hospice settings can look like including home and residential. When asked, Dana [name] notes that she doesn't care where she goes, as she just wants to sleep. Family would be interested in residential hospice. They would prefer placement within [facility].  Review of Systems  Unable to perform ROS: Medical condition    Length of Stay: 5  Current Medications: Scheduled Meds:  . Chlorhexidine Gluconate Cloth  6 each Topical Daily  . docusate sodium  100 mg Oral BID  . guaiFENesin  600 mg Oral BID  . mouth rinse  15 mL Mouth Rinse q12n4p  .  mirtazapine  30 mg Oral QHS  . nicotine  21 mg Transdermal Daily  . OLANZapine zydis  2.5 mg Oral QHS  . polyethylene glycol  17 g Oral Daily  . sodium chloride HYPERTONIC  4 mL Nebulization BID    Continuous Infusions:   PRN Meds: acetaminophen **OR** acetaminophen, antiseptic oral rinse, diphenhydrAMINE, glycopyrrolate **OR** glycopyrrolate **OR** glycopyrrolate, haloperidol **OR** haloperidol **OR** haloperidol lactate, LORazepam **OR** LORazepam **OR** LORazepam, morphine CONCENTRATE **OR** morphine CONCENTRATE, ondansetron **OR** ondansetron (ZOFRAN) IV, polyvinyl alcohol  Physical Exam          Vital Signs: BP (!) 145/60   Pulse 90   Temp 97.9 F (36.6 C) (Axillary)   Resp 13   Ht 5\' 1"  (1.549 m)   Wt 56.9 kg   SpO2 92%   BMI 23.70 kg/m  SpO2: SpO2: 92 % O2 Device: O2 Device: High Flow Nasal Cannula O2 Flow Rate: O2 Flow Rate (L/min): 4 L/min   General: catechetic appearing resting comfortably in bed Neuro: lethargic. Cognition not fully intact.   Skin: scattered bruising HEENT: poor dentition Pulm: mildly dyspneic at rest Cardiac: extremities warm. Peripheral extremity edema present.  Intake/output summary:   Intake/Output Summary (Last 24 hours) at 03/17/2020 1127 Last data filed at 03/17/2020 0747 Gross per 24 hour  Intake 915.62 ml  Output 615 ml  Net 300.62 ml    LBM: Last BM Date:  (PTA) Baseline Weight: Weight: 56.9 kg Most recent weight: Weight: 56.9 kg       Palliative Assessment/Data: PPS: 10%      Patient Active Problem List   Diagnosis Date Noted  . Goals of care, counseling/discussion   . Advanced care planning/counseling discussion   . Palliative care by specialist   . Acute respiratory failure (HCC)   . Hypotension 03/12/2020  . Fall at home, initial encounter 03/12/2020  . Atrial fibrillation with RVR (HCC) 03/12/2020  . Hyperkalemia 03/12/2020  . Subarachnoid bleed (HCC) 03/11/2020  . Iron deficiency anemia 12/20/2019  . CKD (chronic kidney disease), stage II 04/04/2019  . Microalbuminuria due to type 2 diabetes mellitus (HCC) 04/04/2019  . Tobacco use disorder 12/24/2018  . Hyponatremia 12/24/2018  . Type 2 diabetes mellitus with diabetic neuropathy, without long-term current use of insulin (HCC) 01/07/2018  . Hypertension with heart disease 01/07/2018  . Hyperlipidemia associated with type 2 diabetes mellitus (HCC) 01/07/2018  . Major depressive disorder in remission (HCC) 01/07/2018  . Fibromyalgia 01/07/2018  . COPD with acute exacerbation (HCC) 01/04/2018  . Unstable gait 01/04/2018    Palliative Care Assessment & Plan   Patient Profile: 74 y.o. female  with past medical history of hypertension, hyperlipidemia, CKD, T2DM, COPD admitted on 03/11/2020 with recurrent falls and found to be in new onset atrial fibrillation with RVR and hyponatremic (121). Head imaging on admission revealed a small frontal subarachnoid hemorrhage. She was started on a diltazem drip and admitted to [facility]. On the morning of 03/13/20, she developed progressive respiratory failure for which CTA was obtained which showed pulmonary edema with diffuse interstitial pulmonary edema ncreased right heart pressures. Respiratory failure progressed to the point of requiring intubation and she was transferred to [facility]. She was extubated 7/31. Since  extubation, she has began refusing medical therapy. Bedside SLP eval has noted probably severe dysphagia. She is not eating or taking medications. Tells staff to leave her alone and that she wants to die.    Assessment/Recommendations/Plan   Transition to full comfort care.   TOC consulted for assistance with disposition  to residential hospice [facility] place per family preference)  D/C medications and interventions not intended for comfort.  Comfort medications ordered   Code Status:  DNR  Prognosis:   < 2 weeks  due to respiratory failure, aspiration pneumonia, failure to thrive, subarachnoid bleed, no po intake, atrial fibrillation with RVR, heart failure/mitral stenosis with plans for comfort measures only.  Discharge Planning:  Hospice facility  Care plan was discussed with family.  Thank you for allowing the Palliative Medicine Team to assist in the care of this patient.   Time In: 1015 Time Out: 1130 Total Time 75 mins Prolonged Time Billed yes      Greater than 50%  of this time was spent counseling and coordinating care related to the above assessment and plan.   Ocie Bob, AGNP-C Palliative Medicine   Please contact Palliative Medicine Team phone at [phone] for questions and concerns.

## 2020-03-17 NOTE — Progress Notes (Signed)
Inpatient Diabetes Program Recommendations  AACE/ADA: New Consensus Statement on Inpatient Glycemic Control (2015)  Target Ranges:  Prepandial:   less than 140 mg/dL      Peak postprandial:   less than 180 mg/dL (1-2 hours)      Critically ill patients:  140 - 180 mg/dL   Lab Results  Component Value Date   GLUCAP 257 (H) 03/17/2020   HGBA1C 6.8 (H) 03/12/2020    Review of Glycemic Control Results for Dana [name], Dana [name] (MRN [id number]) as of 03/17/2020 10:26  Ref. Range 03/16/2020 07:52 03/16/2020 12:36 03/16/2020 16:49 03/16/2020 19:43 03/16/2020 23:35 03/17/2020 03:48 03/17/2020 07:53  Glucose-Capillary Latest Ref Range: 70 - 99 mg/dL 009 (H) 233 (H) 007 (H) 279 (H) 250 (H) 251 (H) 257 (H)   Current orders for Inpatient glycemic control:  Novolog 0-9 units Q4 hours  Inpatient Diabetes Program Recommendations:    Palliative medicine involved meeting with family. If in plan of care consider: - Levemir 10 units qd   Thanks, Christena Deem RN, MSN, BC-ADM Inpatient Diabetes Coordinator Team Pager [phone] (8a-5p)

## 2020-03-17 NOTE — Progress Notes (Signed)
Manufacturing systems engineer [facility]) Hospital Liaison note.   Received request from [facility] manager for family interest in [facility]. [facility] is unable to offer a room today. Spoke with son [name] about plan and he agrees to moving patient tomorrow once bed is available.   Hospital Liaison will follow up tomorrow or sooner if a room becomes available.   A Please do not hesitate to call with questions.   Thank you,  Yolande Jolly, RN, BSN    [facility] Liaison (listed on AMION under [facility] [place])   [phone]

## 2020-03-17 NOTE — TOC Progression Note (Signed)
Transition of Care [facility]) - Progression Note    Patient Details  Name: Dana [name] MRN: [id number] Date of Birth: [birth year]  Transition of Care [facility]) CM/SW Contact  Golda Acre, RN Phone Number: 03/17/2020, 1:16 PM  Clinical Narrative:    tct-Mary Merilynn Finland with [facility] referral given, she will contact the son.   Expected Discharge Plan: Skilled Nursing Facility Barriers to Discharge: Continued Medical Work up  Expected Discharge Plan and Services Expected Discharge Plan: Skilled Nursing Facility   Discharge Planning Services: CM Consult Post Acute Care Choice: Skilled Nursing Facility Living arrangements for the past 2 months: Single Family Home                                       Social Determinants of Health (SDOH) Interventions    Readmission Risk Interventions No flowsheet data found.

## 2020-03-18 NOTE — Progress Notes (Signed)
Called [employer] and gave report on patient to [employer]. Pt will be going with foley in place but no IV's. All questions answered.

## 2020-03-18 NOTE — Progress Notes (Signed)
Patient is on full comfort measures and awaiting bed at hospice facility. Discharge summary done by Dr Marland Mcalpine on 8/3, no interval changes. Family updated at bedside.

## 2020-03-18 NOTE — TOC Transition Note (Signed)
Transition of Care [facility]) - CM/SW Discharge Note   Patient Details  Name: Dana [name] MRN: [id number] Date of Birth: [birth year]  Transition of Care [facility]) CM/SW Contact:  Golda Acre, RN Phone Number: 03/18/2020, 11:36 AM   Clinical Narrative:    Approved to go to [facility] Place/floor rn given transport packet/ptar called at 1136/call report to [phone]   Final next level of care: Hospice Medical Facility Barriers to Discharge: Barriers Resolved   Patient Goals and CMS Choice Patient states their goals for this hospitalization and ongoing recovery are:: i want to die CMS Medicare.gov Compare Post Acute Care list provided to:: Patient    Discharge Placement                       Discharge Plan and Services   Discharge Planning Services: CM Consult Post Acute Care Choice: Skilled Nursing Facility                               Social Determinants of Health (SDOH) Interventions     Readmission Risk Interventions No flowsheet data found.

## 2020-03-18 NOTE — Progress Notes (Signed)
Manufacturing systems engineer [facility]) Hospital Liaison note.   Received request from [facility] manager for family interest in [facility] with request for transfer today. Chart reviewed and eligibility confirmed.  Spoke to son Dana [name] to o confirm interest and explain services. Family agreeable to transfer today. CSW aware.    I will notify TOC once Registration paperwork completed. Son [name] is meeting [facility] SW around 11am to complete. Dr. Kern Reap  to assume care per family request.    RN please call report to [phone]. Please arrange transport for patient   Thank you,   Yolande Jolly, RN, BSN    [facility] Liaison (listed on AMION under [facility] [place])   [phone]

## 2020-04-15 DEATH — deceased

## 2020-06-26 ENCOUNTER — Ambulatory Visit: Payer: Medicare Other | Admitting: Family Medicine

## 2022-05-18 IMAGING — CT CT HEAD W/O CM
3 of 4 series · 15 of 47 positions shown, 18 images · non-contrast
Comparison: [DATE] [DATE], [DATE], [DATE] [DATE], [DATE]

CLINICAL DATA: Subarachnoid hemorrhage

EXAM:
CT HEAD WITHOUT CONTRAST
TECHNIQUE: Contiguous axial images were obtained from the base of the skull
through the vertex without intravenous contrast.

[Series 2: head wo · axial · 0.47mm/px · z∈[-157,-42]mm · 9 of 29 slices shown, 12 images]
[im 3/29  brain]
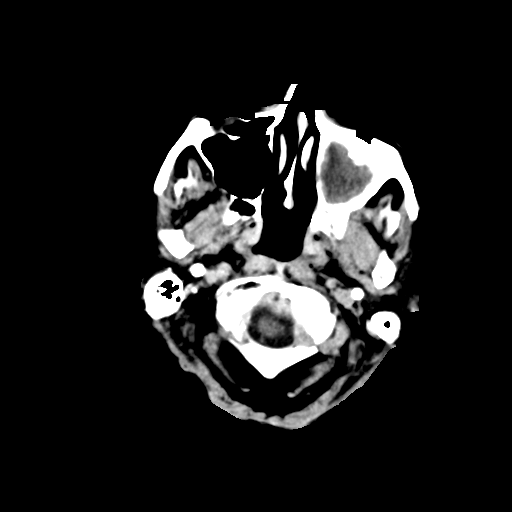
[im 3/29  bone]
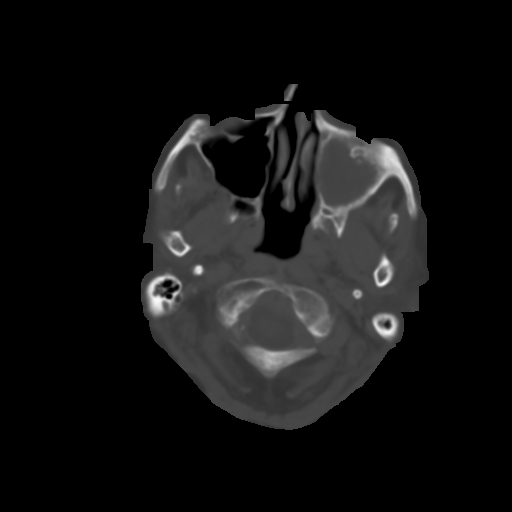
[im 6/29  brain]
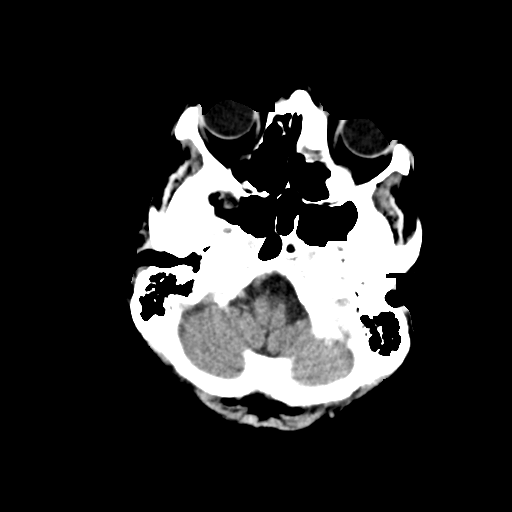
[im 9/29  brain]
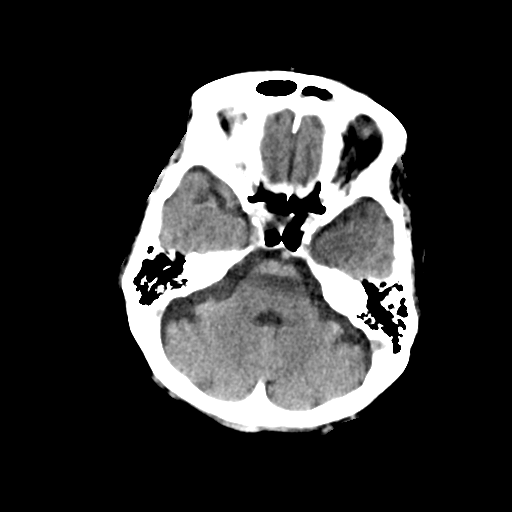
[im 12/29  brain]
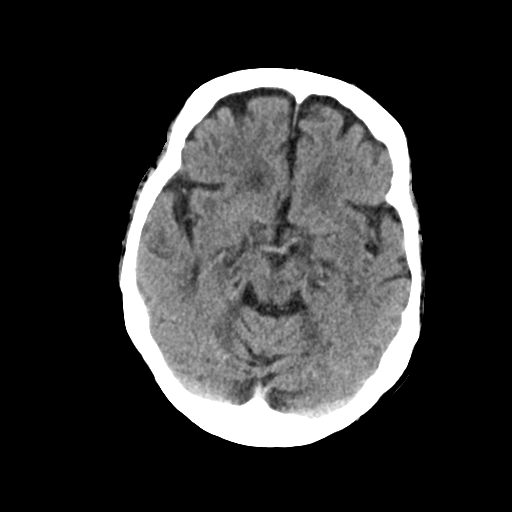
[im 15/29  brain]
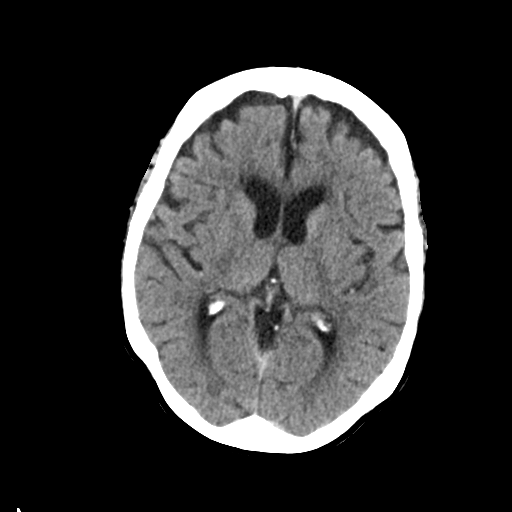
[im 15/29  bone]
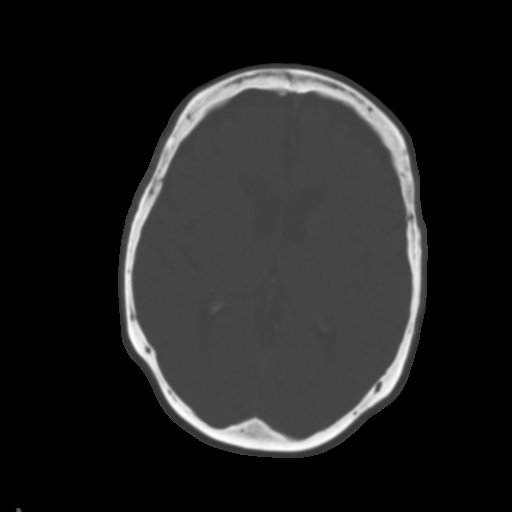
[im 17/29  brain]
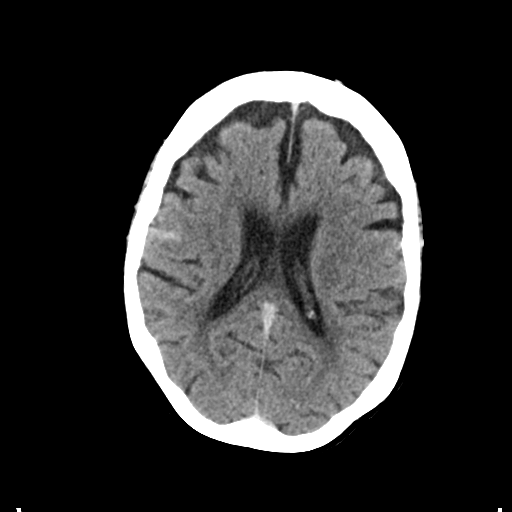
[im 20/29  brain]
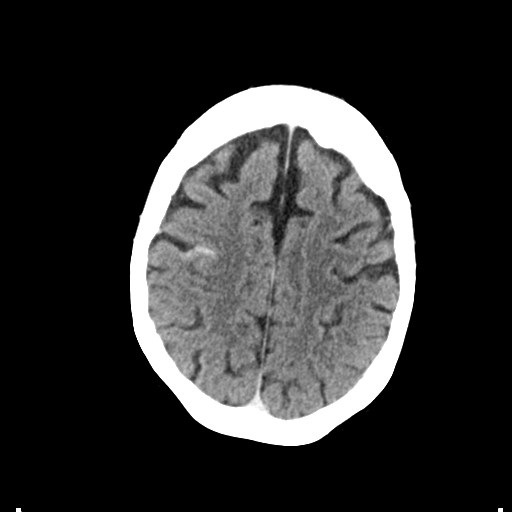
[im 23/29  brain]
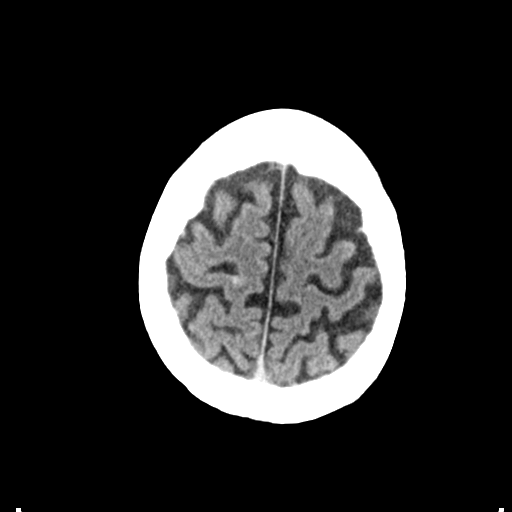
[im 26/29  brain]
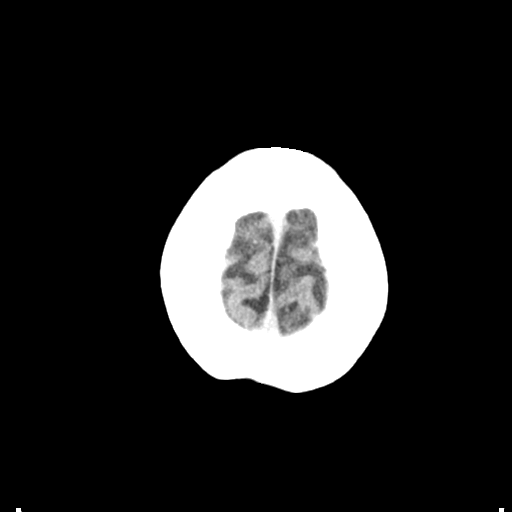
[im 26/29  bone]
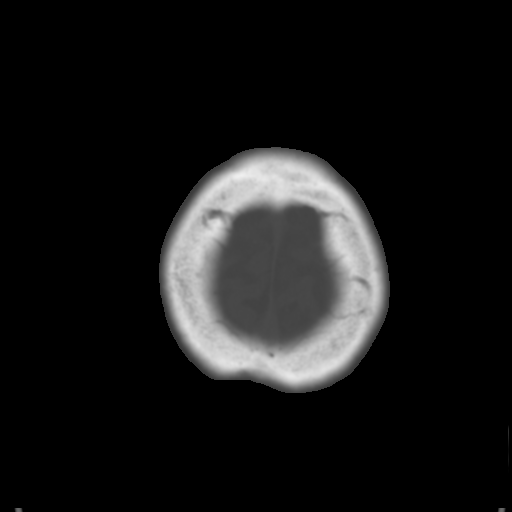

[Series 4: coronal soft tissue · coronal · 0.30mm/px · 3 of 67 slices shown]
[im 23/67  brain]
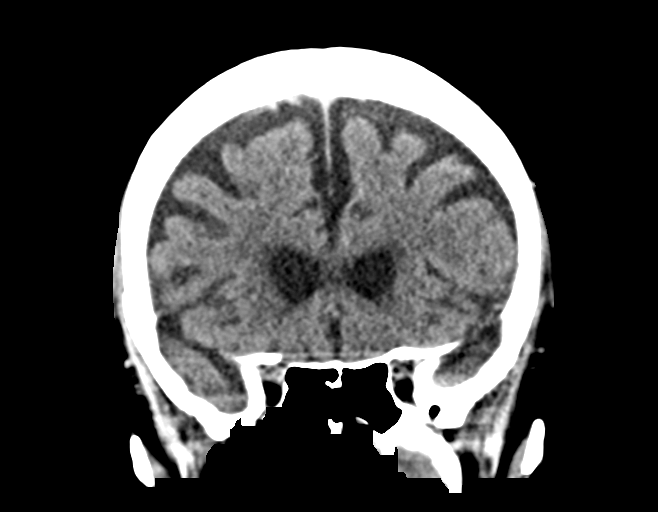
[im 30/67  brain]
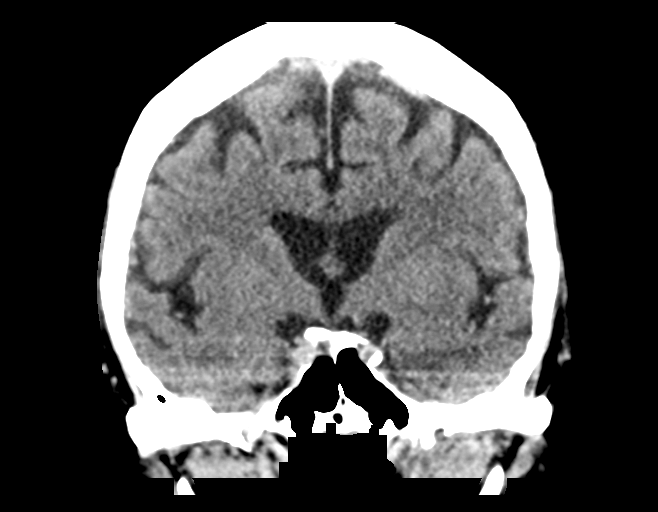
[im 37/67  brain]
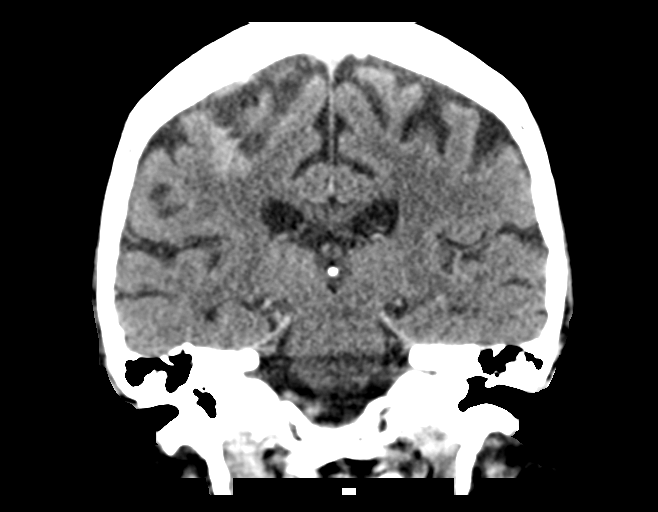

[Series 5: sagittal soft tissue · sagittal · 0.32mm/px · 3 of 56 slices shown]
[im 19/56  brain]
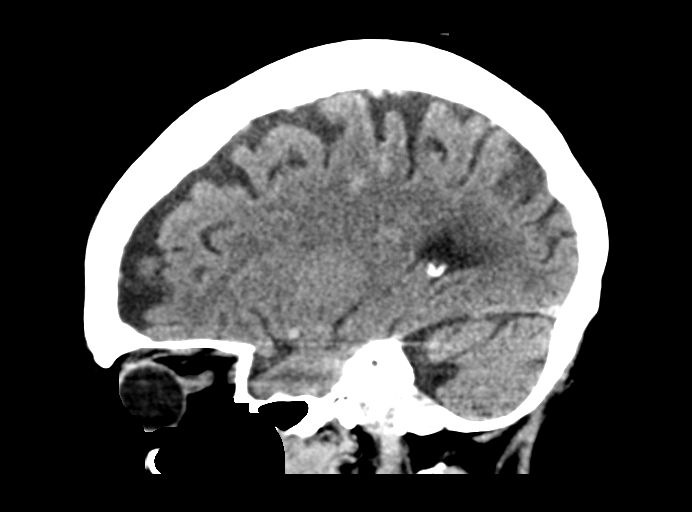
[im 28/56  brain]
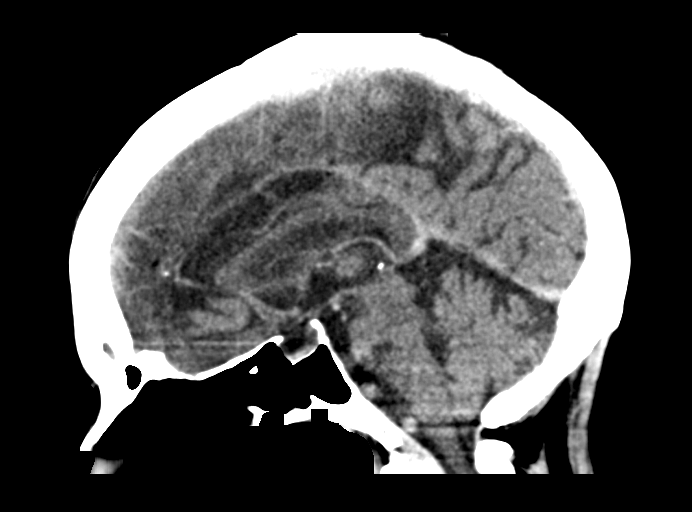
[im 37/56  brain]
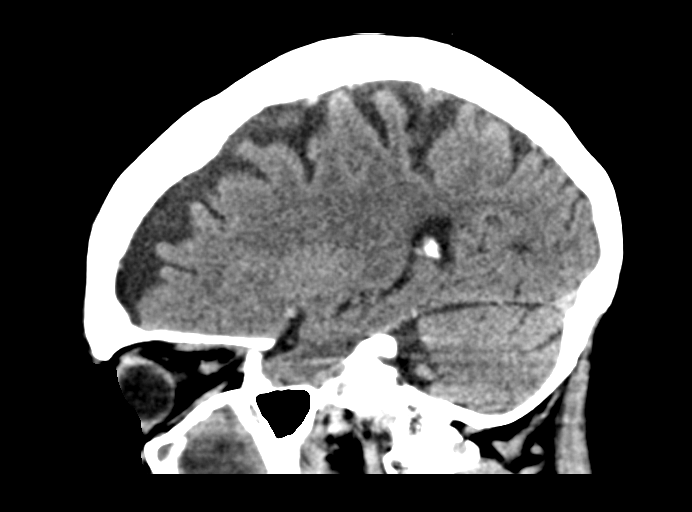

[15 of 47 positions shown; findings below may reference images not displayed]

FINDINGS: Brain: Similar appearance of subarachnoid hemorrhage overlying the
RIGHT frontal convexity. Unchanged size and configuration of the
ventricular system. No new hemorrhage. Periventricular white matter
hypodensities consistent with sequela of chronic microvascular
ischemic disease.

Vascular: Conspicuity of the vessels secondary to recent contrast
administration.

Skull: No acute finding.

Sinuses/Orbits: Chronic sinus disease of the LEFT maxillary sinus,
frontal sinus, and ethmoid air cells. Status post cataract surgery.

Other: None
IMPRESSION: Similar appearance of subarachnoid hemorrhage overlying the RIGHT
frontal convexity. No new hemorrhage.

## 2022-05-21 IMAGING — DX DG CHEST 1V PORT
1 series · 1 of 1 positions shown · non-contrast
Comparison: 03/14/2020

CLINICAL DATA: Acute respiratory failure

EXAM:
PORTABLE CHEST 1 VIEW

[chest ap]
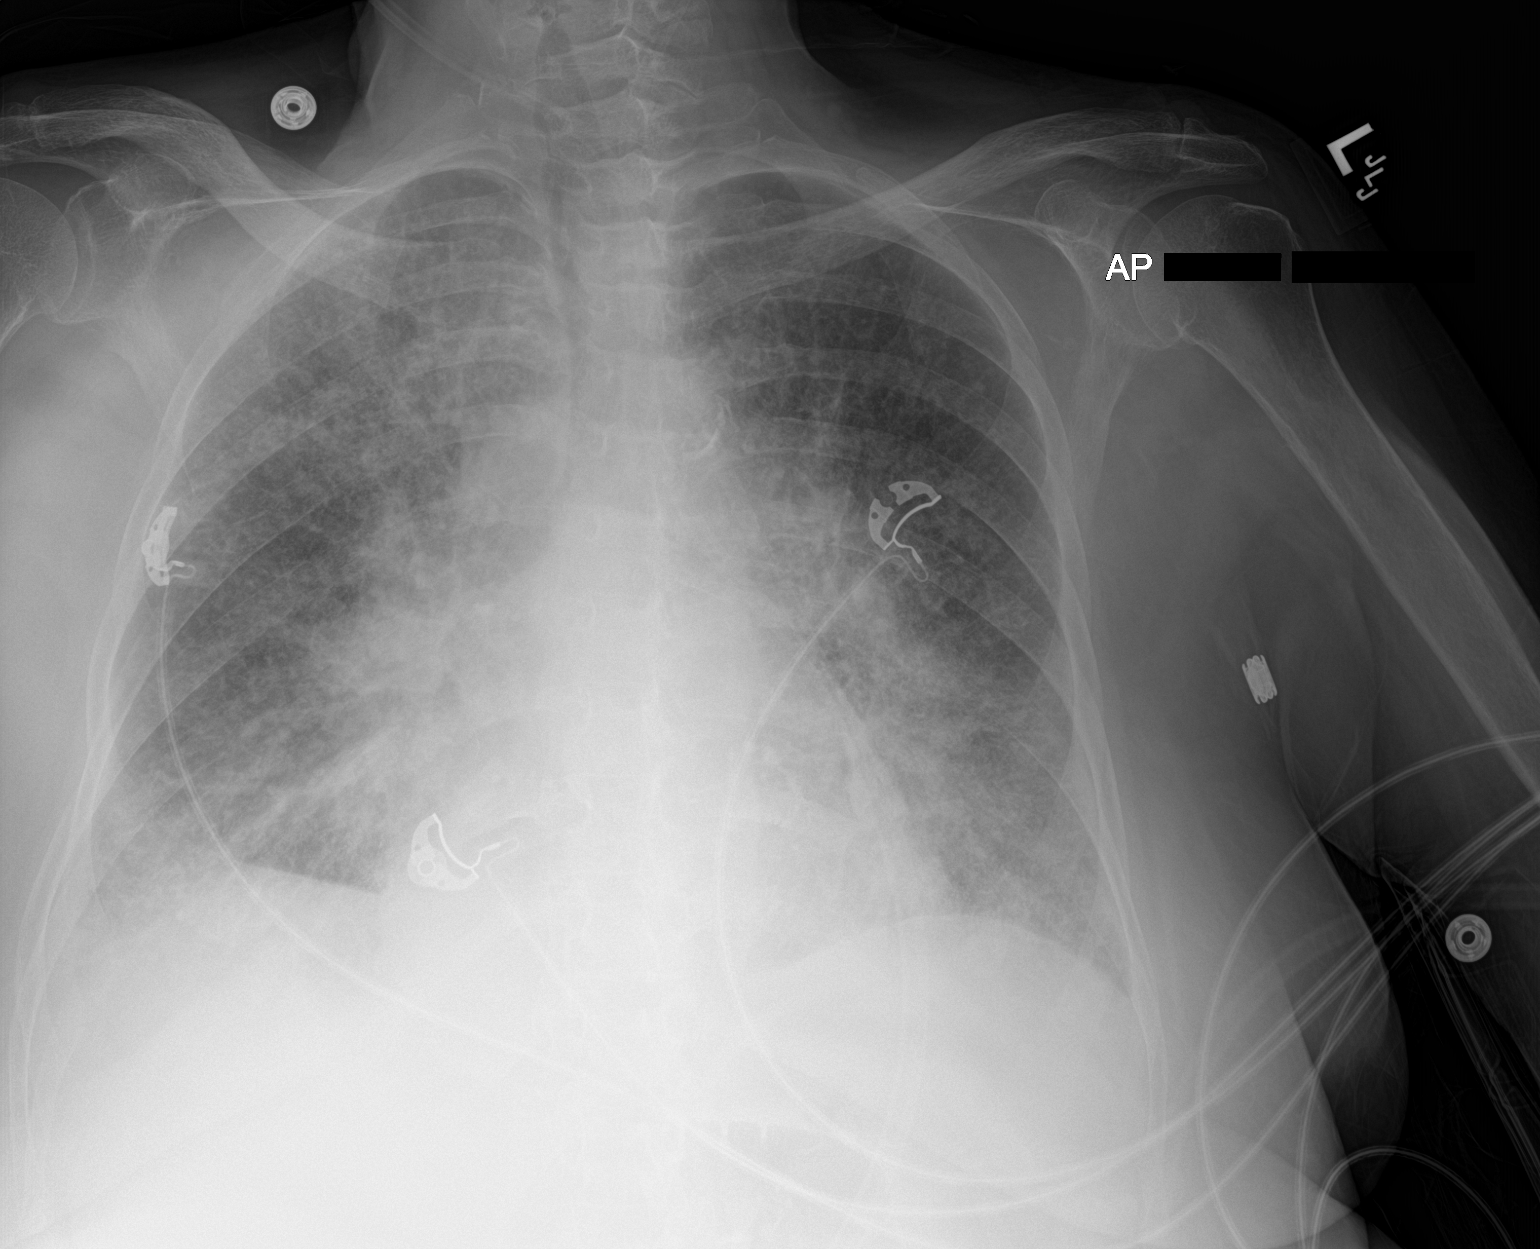

[1 of 1 positions shown; findings below may reference images not displayed]

FINDINGS: Borderline heart size. Diffuse airspace disease throughout both
lungs. Probable small effusions. Changes likely due to edema
although diffuse pneumonia could have this appearance. Appearances
are probably similar to prior study, lying for differences in
technique. The endotracheal and enteric tubes were removed.
IMPRESSION: Diffuse bilateral airspace disease with probable small effusions.
